# Patient Record
Sex: Female | Born: 1950 | Race: Black or African American | Hispanic: No | Marital: Married | Smoking: Never smoker
Health system: Southern US, Community
[De-identification: ages and names within clinical notes are randomized; demographics above are authoritative.]

## PROBLEM LIST (undated history)

## (undated) DIAGNOSIS — T8859XA Other complications of anesthesia, initial encounter: Secondary | ICD-10-CM

## (undated) DIAGNOSIS — R51 Headache: Secondary | ICD-10-CM

## (undated) DIAGNOSIS — M1990 Unspecified osteoarthritis, unspecified site: Secondary | ICD-10-CM

## (undated) DIAGNOSIS — I517 Cardiomegaly: Secondary | ICD-10-CM

## (undated) DIAGNOSIS — R519 Headache, unspecified: Secondary | ICD-10-CM

## (undated) DIAGNOSIS — J302 Other seasonal allergic rhinitis: Secondary | ICD-10-CM

## (undated) DIAGNOSIS — M2022 Hallux rigidus, left foot: Secondary | ICD-10-CM

## (undated) DIAGNOSIS — E119 Type 2 diabetes mellitus without complications: Secondary | ICD-10-CM

## (undated) DIAGNOSIS — T4145XA Adverse effect of unspecified anesthetic, initial encounter: Secondary | ICD-10-CM

## (undated) DIAGNOSIS — I10 Essential (primary) hypertension: Secondary | ICD-10-CM

## (undated) DIAGNOSIS — D649 Anemia, unspecified: Secondary | ICD-10-CM

## (undated) HISTORY — PX: ABDOMINAL HYSTERECTOMY: SHX81

## (undated) HISTORY — PX: KNEE ARTHROSCOPY: SUR90

## (undated) HISTORY — PX: CERVIX LESION DESTRUCTION: SHX591

## (undated) HISTORY — PX: NASAL SEPTUM SURGERY: SHX37

## (undated) HISTORY — PX: COLONOSCOPY: SHX174

---

## 1999-05-18 ENCOUNTER — Other Ambulatory Visit: Admission: RE | Admit: 1999-05-18 | Discharge: 1999-05-18 | Payer: Self-pay | Admitting: Obstetrics & Gynecology

## 2002-09-03 ENCOUNTER — Other Ambulatory Visit: Admission: RE | Admit: 2002-09-03 | Discharge: 2002-09-03 | Payer: Self-pay | Admitting: Obstetrics & Gynecology

## 2003-09-17 ENCOUNTER — Other Ambulatory Visit: Admission: RE | Admit: 2003-09-17 | Discharge: 2003-09-17 | Payer: Self-pay | Admitting: Obstetrics & Gynecology

## 2004-05-02 ENCOUNTER — Encounter: Admission: RE | Admit: 2004-05-02 | Discharge: 2004-05-02 | Payer: Self-pay | Admitting: Obstetrics & Gynecology

## 2004-09-29 ENCOUNTER — Other Ambulatory Visit: Admission: RE | Admit: 2004-09-29 | Discharge: 2004-09-29 | Payer: Self-pay | Admitting: Obstetrics & Gynecology

## 2005-09-21 ENCOUNTER — Ambulatory Visit (HOSPITAL_BASED_OUTPATIENT_CLINIC_OR_DEPARTMENT_OTHER): Admission: RE | Admit: 2005-09-21 | Discharge: 2005-09-21 | Payer: Self-pay | Admitting: Orthopedic Surgery

## 2005-10-20 ENCOUNTER — Other Ambulatory Visit: Admission: RE | Admit: 2005-10-20 | Discharge: 2005-10-20 | Payer: Self-pay | Admitting: Obstetrics & Gynecology

## 2011-06-22 ENCOUNTER — Encounter (HOSPITAL_BASED_OUTPATIENT_CLINIC_OR_DEPARTMENT_OTHER): Payer: Self-pay | Admitting: Anesthesiology

## 2011-06-22 ENCOUNTER — Ambulatory Visit (HOSPITAL_BASED_OUTPATIENT_CLINIC_OR_DEPARTMENT_OTHER)
Admission: RE | Admit: 2011-06-22 | Discharge: 2011-06-22 | Disposition: A | Discharge status: Home / Self Care | Payer: Managed Care, Other (non HMO) | Source: Ambulatory Visit | Attending: Orthopedic Surgery | Admitting: Orthopedic Surgery

## 2011-06-22 ENCOUNTER — Encounter (HOSPITAL_BASED_OUTPATIENT_CLINIC_OR_DEPARTMENT_OTHER)
Admission: RE | Disposition: A | Discharge status: Home / Self Care | Payer: Self-pay | Source: Ambulatory Visit | Attending: Orthopedic Surgery

## 2011-06-22 ENCOUNTER — Other Ambulatory Visit: Payer: Self-pay

## 2011-06-22 ENCOUNTER — Encounter (HOSPITAL_BASED_OUTPATIENT_CLINIC_OR_DEPARTMENT_OTHER): Payer: Self-pay | Admitting: *Deleted

## 2011-06-22 ENCOUNTER — Ambulatory Visit (HOSPITAL_BASED_OUTPATIENT_CLINIC_OR_DEPARTMENT_OTHER): Payer: Managed Care, Other (non HMO) | Admitting: Anesthesiology

## 2011-06-22 DIAGNOSIS — Z4789 Encounter for other orthopedic aftercare: Secondary | ICD-10-CM

## 2011-06-22 DIAGNOSIS — I10 Essential (primary) hypertension: Secondary | ICD-10-CM | POA: Insufficient documentation

## 2011-06-22 DIAGNOSIS — M2020 Hallux rigidus, unspecified foot: Secondary | ICD-10-CM | POA: Insufficient documentation

## 2011-06-22 DIAGNOSIS — Z01810 Encounter for preprocedural cardiovascular examination: Secondary | ICD-10-CM | POA: Insufficient documentation

## 2011-06-22 HISTORY — DX: Essential (primary) hypertension: I10

## 2011-06-22 HISTORY — DX: Other complications of anesthesia, initial encounter: T88.59XA

## 2011-06-22 HISTORY — PX: CHEILECTOMY: SHX1336

## 2011-06-22 HISTORY — DX: Adverse effect of unspecified anesthetic, initial encounter: T41.45XA

## 2011-06-22 LAB — POCT I-STAT, CHEM 8
Calcium, Ion: 1.16 mmol/L (ref 1.12–1.32)
Chloride: 105 mEq/L (ref 96–112)
Creatinine, Ser: 1 mg/dL (ref 0.50–1.10)
Glucose, Bld: 132 mg/dL — ABNORMAL HIGH (ref 70–99)
Potassium: 4.4 mEq/L (ref 3.5–5.1)

## 2011-06-22 SURGERY — CHEILECTOMY
Anesthesia: General | Site: Toe | Laterality: Right | Wound class: Clean

## 2011-06-22 MED ORDER — PROMETHAZINE HCL 25 MG/ML IJ SOLN: 6.2500 mg | INTRAMUSCULAR | Status: DC | PRN

## 2011-06-22 MED ORDER — OXYCODONE-ACETAMINOPHEN 5-325 MG PO TABS
1.0000 | ORAL_TABLET | ORAL | Status: DC | PRN
  Administered 2011-06-22: 1 via ORAL

## 2011-06-22 MED ORDER — FENTANYL CITRATE 0.05 MG/ML IJ SOLN
50.0000 ug | INTRAMUSCULAR | Status: AC | PRN
  Administered 2011-06-22 (×2): 50 ug via INTRAVENOUS

## 2011-06-22 MED ORDER — PROPOFOL 10 MG/ML IV EMUL
INTRAVENOUS | Status: DC | PRN
  Administered 2011-06-22: 150 mg via INTRAVENOUS

## 2011-06-22 MED ORDER — CEFAZOLIN SODIUM 1-5 GM-% IV SOLN
1.0000 g | INTRAVENOUS | Status: AC
  Administered 2011-06-22: 2 g via INTRAVENOUS

## 2011-06-22 MED ORDER — HYDROMORPHONE HCL PF 1 MG/ML IJ SOLN
0.2500 mg | INTRAMUSCULAR | Status: DC | PRN
  Administered 2011-06-22: 0.5 mg via INTRAVENOUS

## 2011-06-22 MED ORDER — BUPIVACAINE HCL (PF) 0.5 % IJ SOLN
INTRAMUSCULAR | Status: DC | PRN
  Administered 2011-06-22: 6 mL

## 2011-06-22 MED ORDER — DEXAMETHASONE SODIUM PHOSPHATE 10 MG/ML IJ SOLN
INTRAMUSCULAR | Status: DC | PRN
  Administered 2011-06-22: 10 mg via INTRAVENOUS

## 2011-06-22 MED ORDER — MIDAZOLAM HCL 2 MG/2ML IJ SOLN
1.0000 mg | INTRAMUSCULAR | Status: AC | PRN
  Administered 2011-06-22 (×2): 1 mg via INTRAVENOUS

## 2011-06-22 MED ORDER — LACTATED RINGERS IV SOLN
INTRAVENOUS | Status: DC
  Administered 2011-06-22: 07:00:00 via INTRAVENOUS

## 2011-06-22 MED ORDER — CHLORHEXIDINE GLUCONATE 4 % EX LIQD: 60.0000 mL | Freq: Once | CUTANEOUS | Status: DC

## 2011-06-22 MED ORDER — BUPIVACAINE HCL (PF) 0.5 % IJ SOLN
INTRAMUSCULAR | Status: DC | PRN
  Administered 2011-06-22: 35 mL

## 2011-06-22 MED ORDER — FENTANYL CITRATE 0.05 MG/ML IJ SOLN
INTRAMUSCULAR | Status: DC | PRN
  Administered 2011-06-22: 25 ug via INTRAVENOUS

## 2011-06-22 SURGICAL SUPPLY — 61 items
BANDAGE ELASTIC 4 VELCRO ST LF (GAUZE/BANDAGES/DRESSINGS) ×2 IMPLANT
BANDAGE ELASTIC 6 VELCRO ST LF (GAUZE/BANDAGES/DRESSINGS) IMPLANT
BANDAGE ESMARK 6X9 LF (GAUZE/BANDAGES/DRESSINGS) ×1 IMPLANT
BENZOIN TINCTURE PRP APPL 2/3 (GAUZE/BANDAGES/DRESSINGS) IMPLANT
BLADE AVERAGE 25X9 (BLADE) IMPLANT
BLADE OSC/SAG .038X5.5 CUT EDG (BLADE) IMPLANT
BLADE SURG 15 STRL LF DISP TIS (BLADE) ×1 IMPLANT
BLADE SURG 15 STRL SS (BLADE) ×1
BNDG COHESIVE 4X5 TAN STRL (GAUZE/BANDAGES/DRESSINGS) ×2 IMPLANT
BNDG ESMARK 6X9 LF (GAUZE/BANDAGES/DRESSINGS) ×2
CANISTER SUCTION 1200CC (MISCELLANEOUS) IMPLANT
CLOTH BEACON ORANGE TIMEOUT ST (SAFETY) ×2 IMPLANT
COVER TABLE BACK 60X90 (DRAPES) ×2 IMPLANT
CUFF TOURNIQUET SINGLE 18IN (TOURNIQUET CUFF) IMPLANT
CUFF TOURNIQUET SINGLE 24IN (TOURNIQUET CUFF) ×2 IMPLANT
CUFF TOURNIQUET SINGLE 34IN LL (TOURNIQUET CUFF) IMPLANT
DECANTER SPIKE VIAL GLASS SM (MISCELLANEOUS) ×2 IMPLANT
DRAPE EXTREMITY T 121X128X90 (DRAPE) ×2 IMPLANT
DRAPE OEC MINIVIEW 54X84 (DRAPES) ×2 IMPLANT
DRAPE U 20/CS (DRAPES) ×2 IMPLANT
DRAPE U-SHAPE 47X51 STRL (DRAPES) ×2 IMPLANT
DURAPREP 26ML APPLICATOR (WOUND CARE) ×2 IMPLANT
ELECT NEEDLE TIP 2.8 STRL (NEEDLE) IMPLANT
ELECT REM PT RETURN 9FT ADLT (ELECTROSURGICAL) ×2
ELECTRODE REM PT RTRN 9FT ADLT (ELECTROSURGICAL) ×1 IMPLANT
GAUZE XEROFORM 1X8 LF (GAUZE/BANDAGES/DRESSINGS) ×2 IMPLANT
GLOVE BIOGEL PI IND STRL 8 (GLOVE) ×1 IMPLANT
GLOVE BIOGEL PI INDICATOR 8 (GLOVE) ×1
GLOVE ORTHO TXT STRL SZ7.5 (GLOVE) IMPLANT
GLOVE SKINSENSE NS SZ6.5 (GLOVE) ×1
GLOVE SKINSENSE NS SZ7.5 (GLOVE) ×2
GLOVE SKINSENSE STRL SZ6.5 (GLOVE) ×1 IMPLANT
GLOVE SKINSENSE STRL SZ7.5 (GLOVE) ×2 IMPLANT
GOWN BRE IMP PREV XXLGXLNG (GOWN DISPOSABLE) ×2 IMPLANT
GOWN PREVENTION PLUS XLARGE (GOWN DISPOSABLE) ×4 IMPLANT
NEEDLE HYPO 25X1 1.5 SAFETY (NEEDLE) ×2 IMPLANT
NS IRRIG 1000ML POUR BTL (IV SOLUTION) ×2 IMPLANT
PACK BASIN DAY SURGERY FS (CUSTOM PROCEDURE TRAY) ×2 IMPLANT
PAD CAST 3X4 CTTN HI CHSV (CAST SUPPLIES) IMPLANT
PAD CAST 4YDX4 CTTN HI CHSV (CAST SUPPLIES) ×1 IMPLANT
PADDING CAST COTTON 3X4 STRL (CAST SUPPLIES)
PADDING CAST COTTON 4X4 STRL (CAST SUPPLIES) ×1
PENCIL BUTTON HOLSTER BLD 10FT (ELECTRODE) ×2 IMPLANT
SPONGE GAUZE 4X4 12PLY (GAUZE/BANDAGES/DRESSINGS) ×2 IMPLANT
SPONGE LAP 4X18 X RAY DECT (DISPOSABLE) IMPLANT
STOCKINETTE 4X48 STRL (DRAPES) ×2 IMPLANT
SUCTION FRAZIER TIP 10 FR DISP (SUCTIONS) IMPLANT
SUT ETHIBOND 2 OS 4 DA (SUTURE) IMPLANT
SUT ETHILON 3 0 PS 1 (SUTURE) ×2 IMPLANT
SUT VIC AB 0 SH 27 (SUTURE) ×2 IMPLANT
SUT VIC AB 2-0 SH 27 (SUTURE) ×1
SUT VIC AB 2-0 SH 27XBRD (SUTURE) ×1 IMPLANT
SUT VIC AB 3-0 SH 27 (SUTURE) ×1
SUT VIC AB 3-0 SH 27X BRD (SUTURE) ×1 IMPLANT
SUT VICRYL 4-0 PS2 18IN ABS (SUTURE) IMPLANT
SYR BULB 3OZ (MISCELLANEOUS) ×2 IMPLANT
SYR CONTROL 10ML LL (SYRINGE) ×2 IMPLANT
TUBE CONNECTING 20X1/4 (TUBING) IMPLANT
UNDERPAD 30X30 INCONTINENT (UNDERPADS AND DIAPERS) ×2 IMPLANT
WATER STERILE IRR 1000ML POUR (IV SOLUTION) IMPLANT
YANKAUER SUCT BULB TIP NO VENT (SUCTIONS) IMPLANT

## 2011-06-22 NOTE — Interval H&P Note (Signed)
History and Physical Interval Note:  06/22/2011 7:00 AM  Jennifer Ward  has presented today for surgery, with the diagnosis of right great toe hallus rigidus  The various methods of treatment have been discussed with the patient and family. After consideration of risks, benefits and other options for treatment, the patient has consented to  Procedure(s): CHEILECTOMY as a surgical intervention .  The patients' history has been reviewed, patient examined, no change in status, stable for surgery.  I have reviewed the patients' chart and labs.  Questions were answered to the patient's satisfaction.     Francys Bolin F

## 2011-06-22 NOTE — Progress Notes (Signed)
Assisted Dr. Jean Rosenthal with right, ankle block. Side rails up, monitors on throughout procedure. See vital signs in flow sheet. Tolerated Procedure well.

## 2011-06-22 NOTE — Anesthesia Procedure Notes (Addendum)
Anesthesia Regional Block:  Ankle block  Pre-Anesthetic Checklist: ,, timeout performed, Correct Patient, Correct Site, Correct Laterality, Correct Procedure, Correct Position, site marked, Risks and benefits discussed,  Surgical consent,  Pre-op evaluation,  At surgeon's request and post-op pain management  Laterality: Right  Prep: chloraprep       Needles:  Injection technique: Single-shot  Needle Type: Other   (eclipse)    Needle Gauge: 25 and 25 G    Additional Needles: Ankle block Narrative:  Start time: 06/22/2011 7:25 AM End time: 06/22/2011 7:30 AM Injection made incrementally with aspirations every 5 mL.  Performed by: Personally  Anesthesiologist: Sandford Craze, MD  Additional Notes: In holding room, routine monitors on.  Sterile prep R ankle.  #25 ga injections, with total 35cc 0.5% Bupivacaine.  Perineural infiltration around deep and sup peroneal, saph, post tib, and sural nerves, no heme.  VSS  Patient tolerated well. Sandford Craze, MD   Procedure Name: LMA Insertion Performed by: Sharyne Richters Pre-anesthesia Checklist: Patient identified, Timeout performed, Emergency Drugs available, Suction available and Patient being monitored Patient Re-evaluated:Patient Re-evaluated prior to inductionOxygen Delivery Method: Circle System Utilized Preoxygenation: Pre-oxygenation with 100% oxygen Intubation Type: IV induction Ventilation: Mask ventilation without difficulty LMA: LMA with gastric port inserted LMA Size: 4.0 Number of attempts: 1 Placement Confirmation: breath sounds checked- equal and bilateral and positive ETCO2 Tube secured with: Tape Dental Injury: Teeth and Oropharynx as per pre-operative assessment

## 2011-06-22 NOTE — Transfer of Care (Signed)
Immediate Anesthesia Transfer of Care Note  Patient: Jennifer Ward  Procedure(s) Performed:  CHEILECTOMY - right great toe hallux rigidus with cheilectomy 1st metatarsophalangeal  Patient Location: PACU  Anesthesia Type: General  Level of Consciousness: awake  Airway & Oxygen Therapy: Patient Spontanous Breathing and Patient connected to face mask oxygen  Post-op Assessment: Report given to PACU RN and Post -op Vital signs reviewed and stable  Post vital signs: Reviewed and stable  Complications: No apparent anesthesia complications

## 2011-06-22 NOTE — Brief Op Note (Signed)
06/22/2011  8:48 AM  PATIENT:  Jennifer Ward  60 y.o. female  PRE-OPERATIVE DIAGNOSIS:  right great toe hallus rigidus  POST-OPERATIVE DIAGNOSIS:  right great toe hallus rigidus  PROCEDURE:  Procedure(s): Right great toe CHEILECTOMY  SURGEON:  Surgeon(s): Loreta Ave, MD  PHYSICIAN ASSISTANT Andra Heslin M :  EBL:  Total I/O In: 1300 [I.V.:1300] Out: -    SPECIMEN:  No Specimen  DISPOSITION OF SPECIMEN:  N/A  COUNTS:  YES  TOURNIQUET:   Total Tourniquet Time Documented: Calf (Right) - 32 minutes     PATIENT DISPOSITION:  PACU - hemodynamically stable.

## 2011-06-22 NOTE — Anesthesia Preprocedure Evaluation (Signed)
Anesthesia Evaluation  Patient identified by MRN, date of birth, ID band Patient awake    Reviewed: Allergy & Precautions, H&P , NPO status , Patient's Chart, lab work & pertinent test results  Airway Mallampati: II TM Distance: >3 FB Neck ROM: Full    Dental No notable dental hx. (+) Caps, Teeth Intact and Dental Advisory Given   Pulmonary neg pulmonary ROS,  clear to auscultation  Pulmonary exam normal       Cardiovascular hypertension, Regular Normal    Neuro/Psych Negative Neurological ROS     GI/Hepatic negative GI ROS, Neg liver ROS,   Endo/Other  Morbid obesity  Renal/GU negative Renal ROS     Musculoskeletal   Abdominal (+) obese,   Peds  Hematology negative hematology ROS (+)   Anesthesia Other Findings   Reproductive/Obstetrics                           Anesthesia Physical Anesthesia Plan  ASA: II  Anesthesia Plan: General   Post-op Pain Management:    Induction:   Airway Management Planned: LMA  Additional Equipment:   Intra-op Plan:   Post-operative Plan:   Informed Consent: I have reviewed the patients History and Physical, chart, labs and discussed the procedure including the risks, benefits and alternatives for the proposed anesthesia with the patient or authorized representative who has indicated his/her understanding and acceptance.   Dental advisory given  Plan Discussed with: CRNA and Surgeon  Anesthesia Plan Comments: (Plan routine monitors, GA with LMA, ankle block for post op analgesia)        Anesthesia Quick Evaluation

## 2011-06-22 NOTE — Anesthesia Postprocedure Evaluation (Signed)
  Anesthesia Post-op Note  Patient: Jennifer Ward  Procedure(s) Performed:  CHEILECTOMY - right great toe hallux rigidus with cheilectomy 1st metatarsophalangeal  Patient Location: PACU  Anesthesia Type: GA combined with regional for post-op pain  Level of Consciousness: awake, alert  and oriented  Airway and Oxygen Therapy: Patient Spontanous Breathing  Post-op Pain: none  Post-op Assessment: Post-op Vital signs reviewed, Patient's Cardiovascular Status Stable, Respiratory Function Stable, Patent Airway, No signs of Nausea or vomiting and Pain level controlled  Post-op Vital Signs: Reviewed and stable  Complications: No apparent anesthesia complications

## 2011-06-22 NOTE — H&P (Signed)
  [facility] [address]   [address] [place], [place] [address] [phone] A Division of [facility]  Loreta Ave, M.D.     Robert A. Thurston Hole, M.D.     Lunette Stands, M.D. Eulas Post, M.D.    Buford Dresser, M.D. Estell Harpin, M.D. Ralene Cork, D.O.          Genene Churn. Barry Dienes, PA-C            Kirstin A. Shepperson, PA-C Janace Litten, OPA-C   RE: Ori, [place]                                [phone]      DOB: December 20, 1950 PROGRESS NOTE: 05-12-11 Benedicta comes in for evaluation and treatment recommendation for her right foot great toe.  Symptoms longstanding, getting worse.  She did have a Cortisone injection back in 2010 that helped for a couple of weeks.  The last two weeks symptoms have gotten worse.  Localized all to the first MP joint, especially dorsal.  Getting stiff.  Losing motion.  Her symptoms are worse with walking increasing speeds requiring more dorsiflexion.      Entire history is reviewed, updated and included in the chart.  Of note, injection and Jobe exercise program for her left shoulder we did back earlier this year has been helpful.  Symptoms continue to be improved after a course of therapy.  She is maintaining good motion.    EXAMINATION: General exam is outlined and included in the chart.  Specifically, healthy appearing 60 year-old female.  Height: 5?5.  Weight: 205 pounds.  Her gait and stance are normal.  Good motion in all joints upper and lower extremities, except for the right great toe.  Obvious hallux rigidus and dorsal spurring.  Dorsiflexion is just about 0 degrees with bony abutment.  Not much pain when the toe is plantar flexed.  Not a lot of grinding, but some crepitus.  No significant hallux valgus or varus.  No lesser toe deformities.  No tenderness or swelling in the midfoot or hindfoot.  Good motion throughout.  Opposite left foot does not have hallux rigidus.        X-RAYS: X-rays show significant hallux rigidus with especially dorsal spurring first MP joint, right foot.  There are global spurs, but most marked dorsally.  50% narrowing of the joint, but certainly not bone on bone.    DISPOSITION:  Progressive hallux rigidus, right foot.  Underlying degenerative arthritis.  Excellent candidate for cheilectomy.  Went over diagnosis and treatment options with her.  I have told her that this procedure will help with motion and help with function, but eventually she could have progressive arthritis that could come down to arthrodesis.  She understands and agrees.  At this point in time further injection is really not going to help her and she agrees.  Procedures, risks, benefits and complications reviewed in detail.  Paperwork complete.  All questions answered.  Anticipated postoperative course reviewed.  See her at the time of operative intervention.  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    Loreta Ave, M.D.   Electronically verified by Loreta Ave, M.D. DFM:jjh D 05-12-11 T 05-15-11

## 2011-06-23 ENCOUNTER — Encounter (HOSPITAL_BASED_OUTPATIENT_CLINIC_OR_DEPARTMENT_OTHER): Payer: Self-pay | Admitting: Orthopedic Surgery

## 2011-06-23 NOTE — Op Note (Signed)
NAME:  Jennifer Ward, Jennifer Ward                       ACCOUNT NO.:  MEDICAL RECORD NO.:  [id number]  LOCATION:                                 FACILITY:  PHYSICIAN:  Loreta Ave, M.D.      DATE OF BIRTH:  DATE OF PROCEDURE:  06/22/2011 DATE OF DISCHARGE:                              OPERATIVE REPORT   PREOPERATIVE DIAGNOSIS:  Right foot hallux rigidus, great toe.  POSTOPERATIVE DIAGNOSIS:  Right foot hallux rigidus, great toe.  PROCEDURE:  Right foot great toe exploration, soft tissue release, cheilectomy.  SURGEON:  Loreta Ave, MD  ASSISTANT:  Genene Churn. Barry Dienes, [place]  ANESTHESIA:  General.  BLOOD LOSS:  Minimal.  SPECIMENS:  None.  CULTURES:  None.  COMPLICATION:  None.  DRESSING:  Soft compressive.  TOURNIQUET TIME:  40 minutes.  PROCEDURE:  The patient was brought to operating room, placed on the operating table in supine position.  After adequate anesthesia had been obtained, foot was prepped and draped in usual sterile fashion. Exsanguinated with elevation of Esmarch.  Tourniquet inflated to 250 mmHg.  Fluoroscopic guidance.  Virtually, no dorsiflexion of the great toe because of dorsal spurring.  Longitudinal incision, dorsal and medial.  Skin and subcutaneous tissues were divided.  Sensory nerves were identified and protected.  Longitudinal exposure of the MP joint with subperiosteal elevation throughout.  Marked spurring on the dorsal aspect of first metatarsal head and the proximal aspect of proximal phalanx completely removed, contoured down smoothly.  This was extended medially and laterally.  At completion, removal of all spurs confirmed. I could get 90 degrees of dorsiflexion visually and fluoroscopically without any difficulty.  Wound irrigated.  There were diffuse grade 3 changes throughout the remaining joint but not completely worn out.  After through irrigation, the capsule was closed with Vicryl.  Skin was closed with nylon.  Margins were injected  with Marcaine.  Sterile compressive dressing was applied.  Anesthesia reversed.  Brought to the recovery room.  Tolerated the surgery well. No complications.     Loreta Ave, M.D.     DFM/MEDQ  D:  06/22/2011  T:  06/22/2011  Job:  [id number]

## 2014-09-08 DIAGNOSIS — M2022 Hallux rigidus, left foot: Secondary | ICD-10-CM

## 2014-09-08 HISTORY — DX: Hallux rigidus, left foot: M20.22

## 2014-09-14 ENCOUNTER — Other Ambulatory Visit: Payer: Self-pay | Admitting: Physician Assistant

## 2014-09-17 ENCOUNTER — Encounter (HOSPITAL_BASED_OUTPATIENT_CLINIC_OR_DEPARTMENT_OTHER): Payer: Self-pay | Admitting: *Deleted

## 2014-09-17 NOTE — Progress Notes (Signed)
   09/17/14 1053  OBSTRUCTIVE SLEEP APNEA  Have you ever been diagnosed with sleep apnea through a sleep study? No  Do you snore loudly (loud enough to be heard through closed doors)?  1  Do you often feel tired, fatigued, or sleepy during the daytime? 0  Has anyone observed you stop breathing during your sleep? 0  Do you have, or are you being treated for high blood pressure? 1  BMI more than 35 kg/m2? 1  Age over 63 years old? 1  Gender: 0  Obstructive Sleep Apnea Score 4

## 2014-09-17 NOTE — Pre-Procedure Instructions (Signed)
To come for BMET and EKG 

## 2014-09-21 ENCOUNTER — Other Ambulatory Visit: Payer: Self-pay

## 2014-09-21 ENCOUNTER — Encounter (HOSPITAL_BASED_OUTPATIENT_CLINIC_OR_DEPARTMENT_OTHER)
Admission: RE | Admit: 2014-09-21 | Discharge: 2014-09-21 | Disposition: A | Discharge status: Home / Self Care | Payer: BLUE CROSS/BLUE SHIELD | Source: Ambulatory Visit | Attending: Orthopedic Surgery | Admitting: Orthopedic Surgery

## 2014-09-21 DIAGNOSIS — M2022 Hallux rigidus, left foot: Secondary | ICD-10-CM | POA: Diagnosis not present

## 2014-09-21 DIAGNOSIS — Z6835 Body mass index (BMI) 35.0-35.9, adult: Secondary | ICD-10-CM | POA: Diagnosis not present

## 2014-09-21 DIAGNOSIS — G4733 Obstructive sleep apnea (adult) (pediatric): Secondary | ICD-10-CM | POA: Diagnosis not present

## 2014-09-21 DIAGNOSIS — E669 Obesity, unspecified: Secondary | ICD-10-CM | POA: Diagnosis not present

## 2014-09-21 DIAGNOSIS — E119 Type 2 diabetes mellitus without complications: Secondary | ICD-10-CM | POA: Diagnosis not present

## 2014-09-21 DIAGNOSIS — I10 Essential (primary) hypertension: Secondary | ICD-10-CM | POA: Diagnosis not present

## 2014-09-22 NOTE — H&P (Signed)
  [facility] [address]   [address] [place], [place] [address] [phone] A Division of [facility]  Loreta Ave, M.D.   Robert A. Thurston Hole, M.D.   Burnell Blanks, M.D.   Eulas Post, M.D.   Lunette Stands, M.D Jewel Baize. Eulah Pont, M.D.  Buford Dresser, M.D.  Estell Harpin, M.D.    Melina Fiddler, M.D. Janalee Dane, PA- C  Mary L. Dub Mikes, PA-C  Kirstin A. Shepperson, PA-C  Josh Fairview, PA-C  [place], [place]   RE: [place], [place]   [phone]      DOB: 04/01/1951 PROGRESS NOTE: 08-18-14 64 year old here with left great toe pain. She has history of hallux rigidus on the left. On 12/28 she had acute flare-up and saw Dr. Farris Has who put her on a 12 day Dosepak and Indomethacin which helped but pain recurred. She has pain which is the same but swelling is decreased. She describes a constant throb worse with walking and a little swelling. She's changed her diet in hopes of decreasing uric acid. She has not had an aspiration or injection in the past.  Past medical, family, social history is detailed on the chart. Review of systems detailed in HPI all others are reviewed and are negative.   EXAMINATION: Well-developed well-nourished female in no acute distress. Alert and oriented x3. Exam of the  left great toe shows moderate tenderness to deep palpation in the 1st MTP joint dorsiflexion to 10 degrees, moderate swelling. She is neurovascularly intact distally. Negative grind test left great toe.  X-RAYS: Previous x-rays left foot show moderate spurring dorsum 1st MTP joint.  IMPRESSION: Flare of hallux rigidus left great toe.  PLAN: At this point we feel it's appropriate to proceed with left great toe chielectomy. She had this procedure on the left foot 4 years ago and is doing well with this. Discussed risks benefits and possible complications of the surgery in detail. All questions answered and paperwork  complete. I gave her a post-op prescription for Percocet 5/325 #60.  Loreta Ave, M.D.  Electronically verified by Loreta Ave, M.D. DFM(MLS):kah D 08-18-14 T 08-21-14

## 2014-09-24 ENCOUNTER — Ambulatory Visit (HOSPITAL_BASED_OUTPATIENT_CLINIC_OR_DEPARTMENT_OTHER): Payer: BLUE CROSS/BLUE SHIELD | Admitting: Anesthesiology

## 2014-09-24 ENCOUNTER — Ambulatory Visit (HOSPITAL_BASED_OUTPATIENT_CLINIC_OR_DEPARTMENT_OTHER)
Admission: RE | Admit: 2014-09-24 | Discharge: 2014-09-24 | Disposition: A | Discharge status: Home / Self Care | Payer: BLUE CROSS/BLUE SHIELD | Source: Ambulatory Visit | Attending: Orthopedic Surgery | Admitting: Orthopedic Surgery

## 2014-09-24 ENCOUNTER — Encounter (HOSPITAL_BASED_OUTPATIENT_CLINIC_OR_DEPARTMENT_OTHER): Payer: Self-pay

## 2014-09-24 ENCOUNTER — Encounter (HOSPITAL_BASED_OUTPATIENT_CLINIC_OR_DEPARTMENT_OTHER)
Admission: RE | Disposition: A | Discharge status: Home / Self Care | Payer: Self-pay | Source: Ambulatory Visit | Attending: Orthopedic Surgery

## 2014-09-24 DIAGNOSIS — M2022 Hallux rigidus, left foot: Secondary | ICD-10-CM | POA: Diagnosis not present

## 2014-09-24 DIAGNOSIS — E119 Type 2 diabetes mellitus without complications: Secondary | ICD-10-CM | POA: Insufficient documentation

## 2014-09-24 DIAGNOSIS — E669 Obesity, unspecified: Secondary | ICD-10-CM | POA: Insufficient documentation

## 2014-09-24 DIAGNOSIS — I10 Essential (primary) hypertension: Secondary | ICD-10-CM | POA: Insufficient documentation

## 2014-09-24 DIAGNOSIS — Z6835 Body mass index (BMI) 35.0-35.9, adult: Secondary | ICD-10-CM | POA: Insufficient documentation

## 2014-09-24 DIAGNOSIS — G4733 Obstructive sleep apnea (adult) (pediatric): Secondary | ICD-10-CM | POA: Insufficient documentation

## 2014-09-24 HISTORY — PX: CHEILECTOMY: SHX1336

## 2014-09-24 HISTORY — DX: Anemia, unspecified: D64.9

## 2014-09-24 HISTORY — DX: Hallux rigidus, left foot: M20.22

## 2014-09-24 HISTORY — DX: Type 2 diabetes mellitus without complications: E11.9

## 2014-09-24 HISTORY — DX: Cardiomegaly: I51.7

## 2014-09-24 HISTORY — DX: Other seasonal allergic rhinitis: J30.2

## 2014-09-24 HISTORY — DX: Unspecified osteoarthritis, unspecified site: M19.90

## 2014-09-24 HISTORY — DX: Headache: R51

## 2014-09-24 HISTORY — DX: Headache, unspecified: R51.9

## 2014-09-24 LAB — POCT I-STAT, CHEM 8
BUN: 10 mg/dL (ref 6–23)
CALCIUM ION: 1.08 mmol/L — AB (ref 1.13–1.30)
Chloride: 104 mmol/L (ref 96–112)
Creatinine, Ser: 0.8 mg/dL (ref 0.50–1.10)
GLUCOSE: 158 mg/dL — AB (ref 70–99)
HCT: 40 % (ref 36.0–46.0)
Hemoglobin: 13.6 g/dL (ref 12.0–15.0)
Potassium: 3.8 mmol/L (ref 3.5–5.1)
SODIUM: 137 mmol/L (ref 135–145)
TCO2: 19 mmol/L (ref 0–100)

## 2014-09-24 LAB — GLUCOSE, CAPILLARY: GLUCOSE-CAPILLARY: 161 mg/dL — AB (ref 70–99)

## 2014-09-24 SURGERY — CHEILECTOMY
Anesthesia: General | Site: Foot | Laterality: Left

## 2014-09-24 MED ORDER — MIDAZOLAM HCL 2 MG/2ML IJ SOLN
INTRAMUSCULAR | Status: AC
  Filled 2014-09-24: qty 2

## 2014-09-24 MED ORDER — FENTANYL CITRATE 0.05 MG/ML IJ SOLN
INTRAMUSCULAR | Status: AC
  Filled 2014-09-24: qty 6

## 2014-09-24 MED ORDER — OXYCODONE HCL 5 MG PO TABS
5.0000 mg | ORAL_TABLET | Freq: Once | ORAL | Status: AC | PRN
  Administered 2014-09-24: 5 mg via ORAL

## 2014-09-24 MED ORDER — OXYCODONE HCL 5 MG/5ML PO SOLN: 5.0000 mg | Freq: Once | ORAL | Status: AC | PRN

## 2014-09-24 MED ORDER — HYDROMORPHONE HCL 1 MG/ML IJ SOLN
INTRAMUSCULAR | Status: AC
  Filled 2014-09-24: qty 1

## 2014-09-24 MED ORDER — FENTANYL CITRATE 0.05 MG/ML IJ SOLN
INTRAMUSCULAR | Status: AC
  Filled 2014-09-24: qty 2

## 2014-09-24 MED ORDER — CHLORHEXIDINE GLUCONATE 4 % EX LIQD: 60.0000 mL | Freq: Once | CUTANEOUS | Status: DC

## 2014-09-24 MED ORDER — PROPOFOL 10 MG/ML IV BOLUS
INTRAVENOUS | Status: DC | PRN
  Administered 2014-09-24: 200 mg via INTRAVENOUS

## 2014-09-24 MED ORDER — LACTATED RINGERS IV SOLN
INTRAVENOUS | Status: DC
  Administered 2014-09-24: 100 mL/h via INTRAVENOUS

## 2014-09-24 MED ORDER — LIDOCAINE HCL (CARDIAC) 20 MG/ML IV SOLN
INTRAVENOUS | Status: DC | PRN
  Administered 2014-09-24: 60 mg via INTRAVENOUS

## 2014-09-24 MED ORDER — HYDROMORPHONE HCL 1 MG/ML IJ SOLN
0.2500 mg | INTRAMUSCULAR | Status: DC | PRN
  Administered 2014-09-24: 0.5 mg via INTRAVENOUS
  Administered 2014-09-24 (×2): 0.25 mg via INTRAVENOUS

## 2014-09-24 MED ORDER — DEXAMETHASONE SODIUM PHOSPHATE 4 MG/ML IJ SOLN
INTRAMUSCULAR | Status: DC | PRN
  Administered 2014-09-24: 10 mg via INTRAVENOUS

## 2014-09-24 MED ORDER — CEFAZOLIN SODIUM-DEXTROSE 2-3 GM-% IV SOLR
2.0000 g | INTRAVENOUS | Status: AC
  Administered 2014-09-24: 2 g via INTRAVENOUS

## 2014-09-24 MED ORDER — CEFAZOLIN SODIUM-DEXTROSE 2-3 GM-% IV SOLR
INTRAVENOUS | Status: AC
  Filled 2014-09-24: qty 50

## 2014-09-24 MED ORDER — FENTANYL CITRATE 0.05 MG/ML IJ SOLN
50.0000 ug | INTRAMUSCULAR | Status: DC | PRN
  Administered 2014-09-24: 100 ug via INTRAVENOUS

## 2014-09-24 MED ORDER — LACTATED RINGERS IV SOLN
INTRAVENOUS | Status: DC
  Administered 2014-09-24: 07:00:00 via INTRAVENOUS

## 2014-09-24 MED ORDER — MIDAZOLAM HCL 2 MG/2ML IJ SOLN
1.0000 mg | INTRAMUSCULAR | Status: DC | PRN
  Administered 2014-09-24: 2 mg via INTRAVENOUS

## 2014-09-24 MED ORDER — ONDANSETRON HCL 4 MG/2ML IJ SOLN: 4.0000 mg | Freq: Once | INTRAMUSCULAR | Status: DC | PRN

## 2014-09-24 MED ORDER — OXYCODONE HCL 5 MG PO TABS
ORAL_TABLET | ORAL | Status: AC
  Filled 2014-09-24: qty 1

## 2014-09-24 MED ORDER — FENTANYL CITRATE 0.05 MG/ML IJ SOLN
INTRAMUSCULAR | Status: DC | PRN
  Administered 2014-09-24: 25 ug via INTRAVENOUS
  Administered 2014-09-24: 50 ug via INTRAVENOUS

## 2014-09-24 MED ORDER — BUPIVACAINE HCL (PF) 0.25 % IJ SOLN
INTRAMUSCULAR | Status: AC
  Filled 2014-09-24: qty 30

## 2014-09-24 MED ORDER — BUPIVACAINE-EPINEPHRINE (PF) 0.5% -1:200000 IJ SOLN
INTRAMUSCULAR | Status: DC | PRN
  Administered 2014-09-24: 25 mL via PERINEURAL

## 2014-09-24 SURGICAL SUPPLY — 60 items
BANDAGE ELASTIC 4 VELCRO ST LF (GAUZE/BANDAGES/DRESSINGS) IMPLANT
BANDAGE ELASTIC 6 VELCRO ST LF (GAUZE/BANDAGES/DRESSINGS) IMPLANT
BANDAGE ESMARK 6X9 LF (GAUZE/BANDAGES/DRESSINGS) ×1 IMPLANT
BENZOIN TINCTURE PRP APPL 2/3 (GAUZE/BANDAGES/DRESSINGS) IMPLANT
BLADE AVERAGE 25MMX9MM (BLADE)
BLADE AVERAGE 25X9 (BLADE) IMPLANT
BLADE OSC/SAG .038X5.5 CUT EDG (BLADE) IMPLANT
BLADE SURG 15 STRL LF DISP TIS (BLADE) ×1 IMPLANT
BLADE SURG 15 STRL SS (BLADE) ×2
BNDG COHESIVE 4X5 TAN STRL (GAUZE/BANDAGES/DRESSINGS) ×3 IMPLANT
BNDG ESMARK 6X9 LF (GAUZE/BANDAGES/DRESSINGS) ×3
CANISTER SUCT 1200ML W/VALVE (MISCELLANEOUS) IMPLANT
CLOSURE WOUND 1/2 X4 (GAUZE/BANDAGES/DRESSINGS) ×1
COVER BACK TABLE 60X90IN (DRAPES) ×3 IMPLANT
CUFF TOURNIQUET SINGLE 18IN (TOURNIQUET CUFF) IMPLANT
CUFF TOURNIQUET SINGLE 24IN (TOURNIQUET CUFF) IMPLANT
DECANTER SPIKE VIAL GLASS SM (MISCELLANEOUS) IMPLANT
DRAPE EXTREMITY T 121X128X90 (DRAPE) ×3 IMPLANT
DRAPE OEC MINIVIEW 54X84 (DRAPES) IMPLANT
DRAPE U 20/CS (DRAPES) ×3 IMPLANT
DRAPE U-SHAPE 47X51 STRL (DRAPES) ×3 IMPLANT
DURAPREP 26ML APPLICATOR (WOUND CARE) ×3 IMPLANT
ELECT REM PT RETURN 9FT ADLT (ELECTROSURGICAL) ×3
ELECTRODE REM PT RTRN 9FT ADLT (ELECTROSURGICAL) ×1 IMPLANT
GAUZE SPONGE 4X4 12PLY STRL (GAUZE/BANDAGES/DRESSINGS) ×3 IMPLANT
GAUZE XEROFORM 1X8 LF (GAUZE/BANDAGES/DRESSINGS) ×3 IMPLANT
GLOVE BIOGEL PI IND STRL 7.0 (GLOVE) ×1 IMPLANT
GLOVE BIOGEL PI INDICATOR 7.0 (GLOVE) ×2
GLOVE SURG SS PI 7.0 STRL IVOR (GLOVE) ×3 IMPLANT
GLOVE SURG SS PI 7.5 STRL IVOR (GLOVE) ×6 IMPLANT
GLOVE SURG SS PI 8.0 STRL IVOR (GLOVE) IMPLANT
GOWN STRL REUS W/ TWL LRG LVL3 (GOWN DISPOSABLE) ×2 IMPLANT
GOWN STRL REUS W/ TWL XL LVL3 (GOWN DISPOSABLE) ×1 IMPLANT
GOWN STRL REUS W/TWL LRG LVL3 (GOWN DISPOSABLE) ×4
GOWN STRL REUS W/TWL XL LVL3 (GOWN DISPOSABLE) ×5 IMPLANT
NEEDLE HYPO 25X1 1.5 SAFETY (NEEDLE) IMPLANT
NS IRRIG 1000ML POUR BTL (IV SOLUTION) ×3 IMPLANT
PACK BASIN DAY SURGERY FS (CUSTOM PROCEDURE TRAY) ×3 IMPLANT
PAD CAST 3X4 CTTN HI CHSV (CAST SUPPLIES) IMPLANT
PAD CAST 4YDX4 CTTN HI CHSV (CAST SUPPLIES) ×2 IMPLANT
PADDING CAST COTTON 3X4 STRL (CAST SUPPLIES)
PADDING CAST COTTON 4X4 STRL (CAST SUPPLIES) ×4
PENCIL BUTTON HOLSTER BLD 10FT (ELECTRODE) ×3 IMPLANT
SPONGE LAP 4X18 X RAY DECT (DISPOSABLE) IMPLANT
STOCKINETTE 4X48 STRL (DRAPES) ×3 IMPLANT
STRIP CLOSURE SKIN 1/2X4 (GAUZE/BANDAGES/DRESSINGS) ×2 IMPLANT
SUCTION FRAZIER TIP 10 FR DISP (SUCTIONS) IMPLANT
SUT ETHIBOND 2 OS 4 DA (SUTURE) IMPLANT
SUT ETHILON 3 0 PS 1 (SUTURE) ×3 IMPLANT
SUT VIC AB 2-0 SH 27 (SUTURE)
SUT VIC AB 2-0 SH 27XBRD (SUTURE) IMPLANT
SUT VIC AB 3-0 SH 27 (SUTURE)
SUT VIC AB 3-0 SH 27X BRD (SUTURE) IMPLANT
SUT VICRYL 4-0 PS2 18IN ABS (SUTURE) IMPLANT
SYR BULB 3OZ (MISCELLANEOUS) ×3 IMPLANT
SYR CONTROL 10ML LL (SYRINGE) ×3 IMPLANT
TUBE CONNECTING 20'X1/4 (TUBING)
TUBE CONNECTING 20X1/4 (TUBING) IMPLANT
UNDERPAD 30X30 INCONTINENT (UNDERPADS AND DIAPERS) ×3 IMPLANT
YANKAUER SUCT BULB TIP NO VENT (SUCTIONS) IMPLANT

## 2014-09-24 NOTE — Interval H&P Note (Signed)
History and Physical Interval Note:  09/24/2014 7:32 AM  Jennifer Ward  has presented today for surgery, with the diagnosis of hallux rigidus,left foot M20.22  The various methods of treatment have been discussed with the patient and family. After consideration of risks, benefits and other options for treatment, the patient has consented to  Procedure(s): LEFT FOOT HALLUX RIGIDUS WITH CHEILECTOMY GREAT TOE (Left) as a surgical intervention .  The patient's history has been reviewed, patient examined, no change in status, stable for surgery.  I have reviewed the patient's chart and labs.  Questions were answered to the patient's satisfaction.     Yolando Gillum F

## 2014-09-24 NOTE — Anesthesia Preprocedure Evaluation (Signed)
Anesthesia Evaluation  Patient identified by MRN, date of birth, ID band Patient awake    Reviewed: Allergy & Precautions, NPO status , Patient's Chart, lab work & pertinent test results  Airway Mallampati: I  TM Distance: >3 FB Neck ROM: Full    Dental  (+) Teeth Intact, Dental Advisory Given   Pulmonary  breath sounds clear to auscultation        Cardiovascular hypertension, Pt. on medications Rhythm:Regular Rate:Normal     Neuro/Psych    GI/Hepatic   Endo/Other  diabetes, Poorly Controlled, Type 2, Oral Hypoglycemic AgentsMorbid obesity  Renal/GU      Musculoskeletal   Abdominal   Peds  Hematology   Anesthesia Other Findings   Reproductive/Obstetrics                             Anesthesia Physical Anesthesia Plan  ASA: III  Anesthesia Plan: General   Post-op Pain Management:    Induction: Intravenous  Airway Management Planned: LMA  Additional Equipment:   Intra-op Plan:   Post-operative Plan: Extubation in OR  Informed Consent: I have reviewed the patients History and Physical, chart, labs and discussed the procedure including the risks, benefits and alternatives for the proposed anesthesia with the patient or authorized representative who has indicated his/her understanding and acceptance.   Dental advisory given  Plan Discussed with: CRNA, Anesthesiologist and Surgeon  Anesthesia Plan Comments:         Anesthesia Quick Evaluation

## 2014-09-24 NOTE — Discharge Instructions (Signed)
Weight bearing as tolerated but must be in post-op shoe.  Change bandage daily starting in 3 days.  May shower in 3 days, but do not soak incision.  May apply ice for up to 20 minutes at a time for pain and swelling.  Follow up appointment in one week.   SEEK IMMEDIATE MEDICAL CARE IF:   You develop increased redness, swelling, or pain around your incision sites.  You have a lot of pain in your leg when you move your foot up and down at your ankle.  There is pus or any unusual drainage coming from your incision sites.  You develop a fever.  You notice a bad smell coming from your incision sites.  Any of your incisions break open (edges do not stay together) after sutures or staples have been removed. Document Released: 01/13/2005 Document Revised: 11/10/2013 Document Reviewed: 11/19/2011 [facility] Patient Information 2015 [place], [place]. This information is not intended to replace advice given to you by your health care provider. Make sure you discuss any questions you have with your health care provider.   Post Anesthesia Home Care Instructions  Activity: Get plenty of rest for the remainder of the day. A responsible adult should stay with you for 24 hours following the procedure.  For the next 24 hours, DO NOT: -Drive a car -Advertising copywriter -Drink alcoholic beverages -Take any medication unless instructed by your physician -Make any legal decisions or sign important papers.  Meals: Start with liquid foods such as gelatin or soup. Progress to regular foods as tolerated. Avoid greasy, spicy, heavy foods. If nausea and/or vomiting occur, drink only clear liquids until the nausea and/or vomiting subsides. Call your physician if vomiting continues.  Special Instructions/Symptoms: Your throat may feel dry or sore from the anesthesia or the breathing tube placed in your throat during surgery. If this causes discomfort, gargle with warm salt water. The discomfort should disappear  within 24 hours.

## 2014-09-24 NOTE — Progress Notes (Signed)
Assisted Dr. Crews with left, ultrasound guided, popliteal/saphenous block. Side rails up, monitors on throughout procedure. See vital signs in flow sheet. Tolerated Procedure well. 

## 2014-09-24 NOTE — Anesthesia Postprocedure Evaluation (Signed)
  Anesthesia Post-op Note  Patient: Jennifer Ward  Procedure(s) Performed: Procedure(s): LEFT FOOT HALLUX RIGIDUS WITH CHEILECTOMY GREAT TOE (Left)  Patient Location: PACU  Anesthesia Type: General   Level of Consciousness: awake, alert  and oriented  Airway and Oxygen Therapy: Patient Spontanous Breathing  Post-op Pain: mild  Post-op Assessment: Post-op Vital signs reviewed  Post-op Vital Signs: Reviewed  Last Vitals:  Filed Vitals:   09/24/14 0911  BP:   Pulse: 69  Temp:   Resp: 20    Complications: No apparent anesthesia complications

## 2014-09-24 NOTE — Anesthesia Procedure Notes (Addendum)
Anesthesia Regional Block:  Adductor canal block  Pre-Anesthetic Checklist: ,, timeout performed, Correct Patient, Correct Site, Correct Laterality, Correct Procedure, Correct Position, site marked, Risks and benefits discussed,  Surgical consent,  Pre-op evaluation,  At surgeon's request and post-op pain management  Laterality: Left and Lower  Prep: chloraprep       Needles:  Injection technique: Single-shot  Needle Type: Echogenic Needle     Needle Length: 9cm 9 cm Needle Gauge: 21 and 21 G    Additional Needles:  Procedures: ultrasound guided (picture in chart) Adductor canal block Narrative:  Start time: 09/24/2014 7:10 AM End time: 09/24/2014 7:15 AM Injection made incrementally with aspirations every 5 mL.  Performed by: Personally  Anesthesiologist: CREWS, DAVID   Procedure Name: LMA Insertion Performed by: York Grice Pre-anesthesia Checklist: Patient identified, Timeout performed, Emergency Drugs available, Suction available and Patient being monitored Patient Re-evaluated:Patient Re-evaluated prior to inductionOxygen Delivery Method: Circle system utilized Preoxygenation: Pre-oxygenation with 100% oxygen Intubation Type: IV induction Ventilation: Mask ventilation without difficulty LMA: LMA inserted LMA Size: 4.0 Tube type: Oral Number of attempts: 1 Placement Confirmation: positive ETCO2 Tube secured with: Tape Dental Injury: Teeth and Oropharynx as per pre-operative assessment

## 2014-09-24 NOTE — Transfer of Care (Signed)
Immediate Anesthesia Transfer of Care Note  Patient: Jennifer Ward  Procedure(s) Performed: Procedure(s): LEFT FOOT HALLUX RIGIDUS WITH CHEILECTOMY GREAT TOE (Left)  Patient Location: PACU  Anesthesia Type:General  Level of Consciousness: awake and sedated  Airway & Oxygen Therapy: Patient Spontanous Breathing and Patient connected to face mask oxygen  Post-op Assessment: Report given to RN and Post -op Vital signs reviewed and stable  Post vital signs: Reviewed and stable  Last Vitals:  Filed Vitals:   09/24/14 0715  BP:   Pulse: 64  Temp:   Resp: 14    Complications: No apparent anesthesia complications

## 2014-09-24 NOTE — Addendum Note (Signed)
Addendum  created 09/24/14 1019 by York Grice, CRNA   Modules edited: Anesthesia Attestations

## 2014-09-25 ENCOUNTER — Encounter (HOSPITAL_BASED_OUTPATIENT_CLINIC_OR_DEPARTMENT_OTHER): Payer: Self-pay | Admitting: Orthopedic Surgery

## 2014-09-25 NOTE — Op Note (Signed)
[name], [name] NO.:  [id number]  MEDICAL RECORD NO.:  [id number]  LOCATION:                                 FACILITY:  PHYSICIAN:  Loreta Ave, M.D. DATE OF BIRTH:  06-14-1951  DATE OF PROCEDURE:  09/24/2014 DATE OF DISCHARGE:  09/23/2014                              OPERATIVE REPORT   PREOPERATIVE DIAGNOSIS:  Hallux rigidus, left foot, first metatarsophalangeal joint.  POSTOPERATIVE DIAGNOSIS:  Hallux rigidus, left foot, first metatarsophalangeal joint.  PROCEDURE:  Cheilectomy first metatarsophalangeal joint, left foot.  SURGEON:  Loreta Ave, MD  ASSISTANT:  Piedad Climes.  ANESTHESIA:  General.  BLOOD LOSS:  Minimal.  SPECIMENS:  None.  CULTURES:  None.  COMPLICATIONS:  None.  DRESSINGS:  Soft compressive wood shoe.  TOURNIQUET TIME:  30 minutes.  DESCRIPTION OF PROCEDURE:  The patient was brought to the operating room, placed on the operating table in supine position.  After adequate anesthesia had been obtained, tourniquet applied, prepped and draped in usual sterile fashion.  Exsanguinated with elevation of Esmarch. Tourniquet inflated to 250 mmHg.  Fluoroscopic guidance throughout. Less than 30 degrees of dorsiflexion with bony abutment dorsal aspect first MTP joint.  Alignment and stability otherwise good.  Longitudinal incision dorsal slightly medial.  Skin and subcutaneous tissue divided. The joint exposed and capsule opened.  All of the thinning spurs on the phalanx as well as the metatarsal head were removed with the oscillating saw and rongeurs and contoured smoothly.  Remaining articular cartilage looked good.  Wound irrigated.  I can get 90 degrees of dorsiflexion on completion.  Capsule closed with Vicryl with nylon on skin.  Sterile compressive dressing applied.  Tourniquet deflated and removed. Anesthesia reversed.  Brought to the recovery room.  Tolerated the surgery well.  No  complications.     Loreta Ave, M.D.     DFM/MEDQ  D:  09/24/2014  T:  09/25/2014  Job:  [id number]

## 2017-04-16 ENCOUNTER — Other Ambulatory Visit: Payer: Self-pay | Admitting: Obstetrics & Gynecology

## 2017-04-16 DIAGNOSIS — R928 Other abnormal and inconclusive findings on diagnostic imaging of breast: Secondary | ICD-10-CM

## 2017-04-23 ENCOUNTER — Ambulatory Visit
Admission: RE | Admit: 2017-04-23 | Discharge: 2017-04-23 | Disposition: A | Discharge status: Home / Self Care | Payer: BLUE CROSS/BLUE SHIELD | Source: Ambulatory Visit | Attending: Obstetrics & Gynecology | Admitting: Obstetrics & Gynecology

## 2017-04-23 ENCOUNTER — Ambulatory Visit: Admission: RE | Admit: 2017-04-23 | Payer: BLUE CROSS/BLUE SHIELD | Source: Ambulatory Visit

## 2017-04-23 DIAGNOSIS — R928 Other abnormal and inconclusive findings on diagnostic imaging of breast: Secondary | ICD-10-CM

## 2017-12-10 ENCOUNTER — Ambulatory Visit (INDEPENDENT_AMBULATORY_CARE_PROVIDER_SITE_OTHER): Payer: BLUE CROSS/BLUE SHIELD | Admitting: Podiatry

## 2017-12-10 ENCOUNTER — Encounter: Payer: Self-pay | Admitting: Podiatry

## 2017-12-10 ENCOUNTER — Ambulatory Visit (INDEPENDENT_AMBULATORY_CARE_PROVIDER_SITE_OTHER): Payer: BLUE CROSS/BLUE SHIELD

## 2017-12-10 DIAGNOSIS — M79672 Pain in left foot: Secondary | ICD-10-CM

## 2017-12-10 DIAGNOSIS — M722 Plantar fascial fibromatosis: Secondary | ICD-10-CM | POA: Diagnosis not present

## 2017-12-10 DIAGNOSIS — M779 Enthesopathy, unspecified: Secondary | ICD-10-CM | POA: Diagnosis not present

## 2017-12-10 MED ORDER — MELOXICAM 15 MG PO TABS: 15.0000 mg | ORAL_TABLET | Freq: Every day | ORAL | 2 refills | Status: DC

## 2017-12-10 NOTE — Progress Notes (Signed)
Subjective:   Patient ID: [name], female   DOB: 67 y.o.   MRN: [id number]   HPI 67 year old female presents the office today for concerns of left foot pain which is been ongoing since February.  She was on vacation in April 2019.  She is borderline diabetic her A1c 5.9.  She describes pain to the bottom of the left heel.  She states that she tries to walk 3 to 4 miles per day and walking to feel better.  Denies any recent injury or trauma to her feet denies any swelling or redness.  She denies any numbness or tingling and pain does not wake up at night.  She has no other concerns.   Review of Systems  All other systems reviewed and are negative.  Past Medical History:  Diagnosis Date  . Anemia    no current med.  . Arthritis    toes  . Complication of anesthesia    states is hard to wake up post-op  . Enlarged heart    no cardiologist, per pt.  . Hallux rigidus of left foot 09/2014  . Hypertension    states under control with med., has been on med. x 8 yr.  . Non-insulin dependent type 2 diabetes mellitus (HCC)   . Seasonal allergies   . Sinus headache     Past Surgical History:  Procedure Laterality Date  . ABDOMINAL HYSTERECTOMY     partial  . CERVIX LESION DESTRUCTION    . CHEILECTOMY  06/22/2011   Procedure: CHEILECTOMY;  Surgeon: Loreta Ave, MD;  Location: [facility];  Service: Orthopedics;  Laterality: Right;  right great toe hallux rigidus with cheilectomy 1st metatarsophalangeal  . CHEILECTOMY Left 09/24/2014   Procedure: LEFT FOOT HALLUX RIGIDUS WITH CHEILECTOMY GREAT TOE;  Surgeon: Mckinley Jewel, MD;  Location: [facility];  Service: Orthopedics;  Laterality: Left;  . COLONOSCOPY    . KNEE ARTHROSCOPY Left   . NASAL SEPTUM SURGERY       Current Outpatient Medications:  .  estradiol (VIVELLE-DOT) 0.025 MG/24HR, Place 1 patch onto the skin 2 (two) times a week., Disp: , Rfl:  .  meloxicam (MOBIC) 15 MG tablet, Take 1 tablet (15  mg total) by mouth daily., Disp: 30 tablet, Rfl: 2 .  metFORMIN (GLUCOPHAGE) 500 MG tablet, Take 500 mg by mouth daily., Disp: , Rfl:  .  valsartan-hydrochlorothiazide (DIOVAN-HCT) 80-12.5 MG per tablet, Take 1 tablet by mouth daily., Disp: , Rfl:   Allergies  Allergen Reactions  . Latex Rash         Objective:  Physical Exam  General: AAO x3, NAD  Dermatological: Skin is warm, dry and supple bilateral. Nails x 10 are well manicured; remaining integument appears unremarkable at this time. There are no open sores, no preulcerative lesions, no rash or signs of infection present.  Vascular: Dorsalis Pedis artery and Posterior Tibial artery pedal pulses are 2/4 bilateral with immedate capillary fill time. Pedal hair growth present. No varicosities and no lower extremity edema present bilateral. There is no pain with calf compression, swelling, warmth, erythema.   Neruologic: Grossly intact via light touch bilateral.  Protective threshold with Semmes Wienstein monofilament intact to all pedal sites bilateral.  Negative Tinel sign.  Musculoskeletal: There is tenderness palpation along the plantar medial tubercle of the calcaneus at the insertion of plantar fascia.  Mild discomfort on the medial band of plantar fascia the arch of the foot.  No pain with lateral compression  of calcaneus.  No pain on the course or insertion of the Achilles tendon.  There is no other areas of pinpoint bony tenderness or pain to vibratory sensation.  Muscular strength 5/5 in all groups tested bilateral.  Gait: Unassisted, Nonantalgic.       Assessment:   67 year old female left foot pain, plantar fasciitis    Plan:  -Treatment options discussed including all alternatives, risks, and complications -Etiology of symptoms were discussed -X-rays were obtained and reviewed with the patient.  No evidence of acute fracture or stress fracture identified today. -Prescribed mobic. Discussed side effects of the  medication and directed to stop if any are to occur and call the office.  -Declined steroid injection -Plantar fascial brace  -Stretching, icing daily -Discussed shoe modifications and orthotics  -Follow-up in 3 to 4 weeks if symptoms continue or sooner if any issues are to arise.  Vivi Barrack DPM

## 2017-12-10 NOTE — Patient Instructions (Signed)

## 2017-12-31 ENCOUNTER — Encounter: Payer: Self-pay | Admitting: Podiatry

## 2017-12-31 ENCOUNTER — Ambulatory Visit (INDEPENDENT_AMBULATORY_CARE_PROVIDER_SITE_OTHER): Payer: BLUE CROSS/BLUE SHIELD | Admitting: Podiatry

## 2017-12-31 DIAGNOSIS — M722 Plantar fascial fibromatosis: Secondary | ICD-10-CM | POA: Diagnosis not present

## 2017-12-31 MED ORDER — TRIAMCINOLONE ACETONIDE 10 MG/ML IJ SUSP
10.0000 mg | Freq: Once | INTRAMUSCULAR | Status: AC
  Administered 2017-12-31: 10 mg

## 2017-12-31 NOTE — Progress Notes (Signed)
Subjective: 67 year old female presents clinic today for follow-up evaluation of left heel pain, plantar fasciitis.  She states that she is better than what she was when I last saw her but she still getting some pain to the bottom of the left heel.  She has been stretching icing and was using a plantar fascial brace and she also has a night splint that she purchased.  Denies any recent injury or trauma or swelling.  She has no other concerns today. Denies any systemic complaints such as fevers, chills, nausea, vomiting. No acute changes since last appointment, and no other complaints at this time.   Objective: AAO x3, NAD DP/PT pulses palpable bilaterally, CRT less than 3 seconds There is improved however continued tenderness palpation on the plantar medial tubercle of the calcaneus at the insertion of plantar fashion the left side.  Plantar fascia appears to be intact.  Mild discomfort on the medial band of plantar fascia the arch of the foot.  No pain with lateral compression of the calcaneus.  No edema, erythema, increase in warmth.  No pain to the Achilles tendon.  No other areas of tenderness. No open lesions or pre-ulcerative lesions.  No pain with calf compression, swelling, warmth, erythema  Assessment: Left heel pain, plantar fasciitis  Plan: -All treatment options discussed with the patient including all alternatives, risks, complications.  -Steroid injection was performed.  See procedure note below. -Continue stretching, icing daily.  Continue with night splint was placed on a partial brace.  She has over-the-counter inserts inside of her shoes. -Patient encouraged to call the office with any questions, concerns, change in symptoms.   Procedure: Injection Tendon/Ligament Discussed alternatives, risks, complications and verbal consent was obtained.  Location: Left plantar fascia at the glabrous junction; medial approach. Skin Prep: Alcohol. Injectate: 0.5cc 0.5% marcaine plain, 0.5  cc 2% lidocaine plain and, 1 cc kenalog 10. Disposition: Patient tolerated procedure well. Injection site dressed with a band-aid.  Post-injection care was discussed and return precautions discussed.    Return in about 3 weeks (around 01/21/2018).  Vivi Barrack DPM

## 2018-01-21 ENCOUNTER — Ambulatory Visit (INDEPENDENT_AMBULATORY_CARE_PROVIDER_SITE_OTHER): Payer: BLUE CROSS/BLUE SHIELD | Admitting: Podiatry

## 2018-01-21 DIAGNOSIS — M722 Plantar fascial fibromatosis: Secondary | ICD-10-CM

## 2018-01-21 MED ORDER — TRIAMCINOLONE ACETONIDE 10 MG/ML IJ SUSP
10.0000 mg | Freq: Once | INTRAMUSCULAR | Status: AC
  Administered 2018-01-21: 10 mg

## 2018-01-22 ENCOUNTER — Telehealth: Payer: Self-pay | Admitting: Podiatry

## 2018-01-22 DIAGNOSIS — M722 Plantar fascial fibromatosis: Secondary | ICD-10-CM | POA: Insufficient documentation

## 2018-01-22 NOTE — Progress Notes (Signed)
Subjective: 67 year old female presents the office today for pain to the bottom of her left heel.  She says she is doing much better however she was in a lot of walking last week to try to get recurrence.  Is also asking for a night splint.  She did purchase an over-the-counter inserts for shoes but they are very flexible.  No recent injury or changes otherwise until last saw him.  enies any systemic complaints such as fevers, chills, nausea, vomiting. No acute changes since last appointment, and no other complaints at this time.   Objective: AAO x3, NAD DP/PT pulses palpable bilaterally, CRT less than 3 seconds There is improved.  Continued tenderness palpation of the plantar medial tubercle of the calcaneus at the insertion of plantar fascia on the left side.  Plantar fascia appears to be intact.  No pain with lateral compression of calcaneus.  Achilles tendon.  No other areas of tenderness.  Equinus is present. No open lesions or pre-ulcerative lesions.  No pain with calf compression, swelling, warmth, erythema  Assessment: Left plantar fasciitis, heel pain  Plan: -All treatment options discussed with the patient including all alternatives, risks, complications.  -Second steroid injection performed today.  See procedure note below. Night splint was also dispensed.  Check orthotic coverage and the information was given to [facility].  Discussed shoe modifications as well. -Patient encouraged to call the office with any questions, concerns, change in symptoms.   Procedure: Injection Tendon/Ligament Discussed alternatives, risks, complications and verbal consent was obtained.  Location: Left plantar fascia at the glabrous junction; medial approach. Skin Prep: Alcohol. Injectate: 0.5cc 0.5% marcaine plain, 0.5 cc 2% lidocaine plain and, 1 cc kenalog 10. Disposition: Patient tolerated procedure well. Injection site dressed with a band-aid.  Post-injection care was discussed and return  precautions discussed.   Vivi Barrack DPM

## 2018-01-22 NOTE — Telephone Encounter (Signed)
Left message for pt to call me to discuss orthotic coverage per Dr Ardelle Anton.

## 2018-01-22 NOTE — Telephone Encounter (Signed)
Pt returned call and they are covered at 80% after deductible( pt has not met it yet) so she would be responsible for 398.00 and I did explain if she decided to do this we could do half down and make payments if needed. Pt is going to decide and will discuss at next appt.

## 2018-02-21 ENCOUNTER — Ambulatory Visit: Payer: BLUE CROSS/BLUE SHIELD | Admitting: Podiatry

## 2018-02-21 DIAGNOSIS — M722 Plantar fascial fibromatosis: Secondary | ICD-10-CM | POA: Diagnosis not present

## 2018-02-21 NOTE — Progress Notes (Signed)
Subjective: 67 year old female presents the office today for evaluation of heel pain on the left side.  She presents today as she has a new pain which started again last week.  Also also saw her she states the pain went away but she was doing some leg exercises at the gym she felt the area stretch and she is had a recurrence of the pain to the bottom of the left heel she states that it feels the same as it did previously.  No recent injury that she can recall she denies any swelling or redness and numbness or tingling.  She would like to go to proceed with orthotics at this time.  She was to hold off on a steroid injection.  Since the pain recurs she is gone back to stretching, icing daily.  She has no other concerns at this time. Denies any systemic complaints such as fevers, chills, nausea, vomiting. No acute changes since last appointment, and no other complaints at this time.   Objective: AAO x3, NAD DP/PT pulses palpable bilaterally, CRT less than 3 seconds There is tenderness palpation on the plantar aspect of the left heel on the plantar medial tubercle of the calcaneus and insertion of plantar fascia.  Plantar fascia appears to be intact.  There is no pain with lateral compression of calcaneus there is no area pinpoint bony tenderness or pain to vibratory sensation.  There is no overlying edema, erythema, increase in warmth.  Negative Tinel sign.  No other areas of tenderness.  Decreased medial arch height.  Scar from prior bunion surgery is well-healed there is recurrence of the bunion. No open lesions or pre-ulcerative lesions.  No pain with calf compression, swelling, warmth, erythema  Assessment: Left plantar fasciitis with flatfoot  Plan: -All treatment options discussed with the patient including all alternatives, risks, complications.  -At this time she wants a steroid injection.  Want to get back to stretching, icing exercises daily.  Discussed shoe modifications as well as orthotics  and she was to proceed with custom orthotics today.  She was seen today by [name] and she was molded orthotics. -RTC 3 weeks with [name] to PUO or sooner if needed -Patient encouraged to call the office with any questions, concerns, change in symptoms.   Jennifer Ward DPM

## 2018-03-14 ENCOUNTER — Ambulatory Visit: Payer: BLUE CROSS/BLUE SHIELD | Admitting: Orthotics

## 2018-03-14 DIAGNOSIS — M722 Plantar fascial fibromatosis: Secondary | ICD-10-CM

## 2018-03-14 NOTE — Progress Notes (Signed)
Patient came in today to pick up custom made foot orthotics.  The goals were accomplished and the patient reported no dissatisfaction with said orthotics.  Patient was advised of breakin period and how to report any issues. 

## 2018-03-18 ENCOUNTER — Other Ambulatory Visit: Payer: Self-pay | Admitting: Podiatry

## 2018-04-19 ENCOUNTER — Other Ambulatory Visit: Payer: Self-pay | Admitting: Obstetrics & Gynecology

## 2018-04-19 DIAGNOSIS — N6489 Other specified disorders of breast: Secondary | ICD-10-CM

## 2018-04-25 ENCOUNTER — Ambulatory Visit
Admission: RE | Admit: 2018-04-25 | Discharge: 2018-04-25 | Disposition: A | Discharge status: Home / Self Care | Payer: BLUE CROSS/BLUE SHIELD | Source: Ambulatory Visit | Attending: Obstetrics & Gynecology | Admitting: Obstetrics & Gynecology

## 2018-04-25 DIAGNOSIS — N6489 Other specified disorders of breast: Secondary | ICD-10-CM

## 2018-09-11 ENCOUNTER — Emergency Department (HOSPITAL_COMMUNITY)
Admission: EM | Admit: 2018-09-11 | Discharge: 2018-09-12 | Disposition: A | Discharge status: Home / Self Care | Payer: BLUE CROSS/BLUE SHIELD | Attending: Emergency Medicine | Admitting: Emergency Medicine

## 2018-09-11 ENCOUNTER — Encounter (HOSPITAL_COMMUNITY): Payer: Self-pay | Admitting: Emergency Medicine

## 2018-09-11 ENCOUNTER — Emergency Department (HOSPITAL_COMMUNITY): Payer: BLUE CROSS/BLUE SHIELD

## 2018-09-11 ENCOUNTER — Other Ambulatory Visit: Payer: Self-pay

## 2018-09-11 DIAGNOSIS — E119 Type 2 diabetes mellitus without complications: Secondary | ICD-10-CM | POA: Insufficient documentation

## 2018-09-11 DIAGNOSIS — I480 Paroxysmal atrial fibrillation: Secondary | ICD-10-CM | POA: Diagnosis not present

## 2018-09-11 DIAGNOSIS — I10 Essential (primary) hypertension: Secondary | ICD-10-CM | POA: Insufficient documentation

## 2018-09-11 DIAGNOSIS — M25512 Pain in left shoulder: Secondary | ICD-10-CM | POA: Diagnosis present

## 2018-09-11 DIAGNOSIS — Z7984 Long term (current) use of oral hypoglycemic drugs: Secondary | ICD-10-CM | POA: Diagnosis not present

## 2018-09-11 LAB — CBC WITH DIFFERENTIAL/PLATELET
Abs Immature Granulocytes: 0.05 10*3/uL (ref 0.00–0.07)
BASOS PCT: 1 %
Basophils Absolute: 0.1 10*3/uL (ref 0.0–0.1)
EOS ABS: 0.2 10*3/uL (ref 0.0–0.5)
Eosinophils Relative: 2 %
HCT: 41.1 % (ref 36.0–46.0)
Hemoglobin: 13.9 g/dL (ref 12.0–15.0)
Immature Granulocytes: 1 %
Lymphocytes Relative: 51 %
Lymphs Abs: 5.5 10*3/uL — ABNORMAL HIGH (ref 0.7–4.0)
MCH: 32 pg (ref 26.0–34.0)
MCHC: 33.8 g/dL (ref 30.0–36.0)
MCV: 94.5 fL (ref 80.0–100.0)
Monocytes Absolute: 0.7 10*3/uL (ref 0.1–1.0)
Monocytes Relative: 7 %
NEUTROS PCT: 38 %
Neutro Abs: 3.9 10*3/uL (ref 1.7–7.7)
Platelets: 307 10*3/uL (ref 150–400)
RBC: 4.35 MIL/uL (ref 3.87–5.11)
RDW: 13.5 % (ref 11.5–15.5)
WBC: 10.4 10*3/uL (ref 4.0–10.5)
nRBC: 0 % (ref 0.0–0.2)

## 2018-09-11 LAB — COMPREHENSIVE METABOLIC PANEL
ALK PHOS: 74 U/L (ref 38–126)
ALT: 23 U/L (ref 0–44)
AST: 27 U/L (ref 15–41)
Albumin: 4.2 g/dL (ref 3.5–5.0)
Anion gap: 15 (ref 5–15)
BILIRUBIN TOTAL: 0.3 mg/dL (ref 0.3–1.2)
BUN: 24 mg/dL — AB (ref 8–23)
CO2: 19 mmol/L — ABNORMAL LOW (ref 22–32)
Calcium: 9.9 mg/dL (ref 8.9–10.3)
Chloride: 103 mmol/L (ref 98–111)
Creatinine, Ser: 1.12 mg/dL — ABNORMAL HIGH (ref 0.44–1.00)
GFR calc Af Amer: 59 mL/min — ABNORMAL LOW (ref >60)
GFR calc non Af Amer: 51 mL/min — ABNORMAL LOW (ref >60)
GLUCOSE: 125 mg/dL — AB (ref 70–99)
POTASSIUM: 3.5 mmol/L (ref 3.5–5.1)
Sodium: 137 mmol/L (ref 135–145)
TOTAL PROTEIN: 7.4 g/dL (ref 6.5–8.1)

## 2018-09-11 LAB — I-STAT TROPONIN, ED: Troponin i, poc: 0.01 ng/mL (ref 0.00–0.08)

## 2018-09-11 LAB — D-DIMER, QUANTITATIVE (NOT AT ARMC): D DIMER QUANT: 1.34 ug{FEU}/mL — AB (ref 0.00–0.50)

## 2018-09-11 MED ORDER — METOPROLOL TARTRATE 5 MG/5ML IV SOLN
5.0000 mg | Freq: Once | INTRAVENOUS | Status: AC
  Administered 2018-09-11: 5 mg via INTRAVENOUS
  Filled 2018-09-11: qty 5

## 2018-09-11 MED ORDER — SODIUM CHLORIDE 0.9 % IV BOLUS
1000.0000 mL | Freq: Once | INTRAVENOUS | Status: AC
Start: 2018-09-11 — End: 2018-09-11
  Administered 2018-09-11: 1000 mL via INTRAVENOUS

## 2018-09-11 MED ORDER — FENTANYL CITRATE (PF) 100 MCG/2ML IJ SOLN
25.0000 ug | Freq: Once | INTRAMUSCULAR | Status: AC
Start: 2018-09-11 — End: 2018-09-11
  Administered 2018-09-11: 25 ug via INTRAVENOUS
  Filled 2018-09-11: qty 2

## 2018-09-11 NOTE — ED Triage Notes (Signed)
Pt arrives to ED from her PCP with complaints of left shoulder pain since since Sunday. Pt went to Urgent Care on Sunday and was discharged on prednisone. Pt followed up with her PCP today and a EKG showed Afib. Pt still complains of left shoulder pain. Pt received 324 ASA.

## 2018-09-11 NOTE — ED Provider Notes (Addendum)
[facility] EMERGENCY DEPARTMENT Provider Note   CSN: [id number] Arrival date & time: 09/11/18  1753    History   Chief Complaint Chief Complaint  Patient presents with  . Shoulder Pain  . Irregular Heart Beat    HPI Jennifer Ward is a 68 y.o. female.     68 y/o female with a PMH of DMT2 presents to the ED with a chief complaint of left shoulder pain x 3 days. Patient reports when she was at church on Sunday she began feeling a shooting stabbing pain to her left arm. She was seen at urgent care where she was diagnosed with muscle spasms and prescribed muscle relaxers to help with her symptoms.  Reports being seen by her PCP today as the pain on her left arm was worsening, she describes it as feeling like she is has some frostbite to her left hand.  Her PCP did an EKG in the office which showed patient was in A. fib, she denies any previous history of A. fib or being on any anticoagulation or antiarrhythmic medication.  Reports the pain is intermittent, better with extending of her arm.  Patient denies any previous cardiac history, reports her mom died likely of a heart attack but it was unknown.  She denies any chest pain, shortness of breath, fever, blood thinner use, weakness.  He denies any previous history of blood clots, reports she is currently taking an estrogen patch, denies any recent travel.      Past Medical History:  Diagnosis Date  . Anemia    no current med.  . Arthritis    toes  . Complication of anesthesia    states is hard to wake up post-op  . Enlarged heart    no cardiologist, per pt.  . Hallux rigidus of left foot 09/2014  . Hypertension    states under control with med., has been on med. x 8 yr.  . Non-insulin dependent type 2 diabetes mellitus (HCC)   . Seasonal allergies   . Sinus headache     Patient Active Problem List   Diagnosis Date Noted  . Plantar fasciitis, left 01/22/2018    Past Surgical History:  Procedure Laterality  Date  . ABDOMINAL HYSTERECTOMY     partial  . CERVIX LESION DESTRUCTION    . CHEILECTOMY  06/22/2011   Procedure: CHEILECTOMY;  Surgeon: Loreta Ave, MD;  Location: [facility];  Service: Orthopedics;  Laterality: Right;  right great toe hallux rigidus with cheilectomy 1st metatarsophalangeal  . CHEILECTOMY Left 09/24/2014   Procedure: LEFT FOOT HALLUX RIGIDUS WITH CHEILECTOMY GREAT TOE;  Surgeon: Mckinley Jewel, MD;  Location: [facility];  Service: Orthopedics;  Laterality: Left;  . COLONOSCOPY    . KNEE ARTHROSCOPY Left   . NASAL SEPTUM SURGERY       OB History   No obstetric history on file.      Home Medications    Prior to Admission medications   Medication Sig Start Date End Date Taking? Authorizing Provider  allopurinol (ZYLOPRIM) 300 MG tablet Take 300 mg by mouth daily. 12/12/17  Yes [provider]  estradiol (VIVELLE-DOT) 0.025 MG/24HR Place 1 patch onto the skin 2 (two) times a week. On Tuesday and Friday   Yes [provider]  loratadine (CLARITIN) 10 MG tablet Take 10 mg by mouth daily. 11/16/17  Yes [provider]  losartan (COZAAR) 25 MG tablet Take 25 mg by mouth daily.  12/25/17  Yes [provider]  metFORMIN (GLUCOPHAGE) 500 MG tablet Take 500 mg by mouth daily.   Yes [provider]  sitaGLIPtin (JANUVIA) 50 MG tablet Take 50 mg by mouth daily.   Yes [provider]  apixaban (ELIQUIS) 5 MG TABS tablet Take 1 tablet (5 mg total) by mouth 2 (two) times daily for 30 days. 09/12/18 10/12/18  Claude Manges, PA-C  meloxicam (MOBIC) 15 MG tablet TAKE 1 TABLET BY MOUTH EVERY DAY Patient not taking: Reported on 09/12/2018 03/19/18   Vivi Barrack, DPM  metoprolol tartrate (LOPRESSOR) 25 MG tablet Take 0.5 tablets (12.5 mg total) by mouth 2 (two) times daily for 30 days. 09/12/18 10/12/18  Claude Manges, PA-C    Family History History reviewed. No pertinent family history.  Social  History Social History   Tobacco Use  . Smoking status: Never Smoker  . Smokeless tobacco: Never Used  Substance Use Topics  . Alcohol use: No  . Drug use: No     Allergies   Latex   Review of Systems Review of Systems  Constitutional: Negative for chills and fever.  HENT: Negative for ear pain and sore throat.   Eyes: Negative for pain and visual disturbance.  Respiratory: Negative for cough and shortness of breath.   Cardiovascular: Negative for chest pain and palpitations.  Gastrointestinal: Negative for abdominal pain and vomiting.  Genitourinary: Negative for dysuria and hematuria.  Musculoskeletal: Positive for arthralgias and myalgias. Negative for back pain.  Skin: Negative for color change and rash.  Neurological: Negative for seizures, syncope, facial asymmetry, light-headedness and headaches.  All other systems reviewed and are negative.    Physical Exam Updated Vital Signs BP 105/63   Pulse 63   Temp 98.3 F (36.8 C) (Oral)   Resp 15   Ht 5\' 4"  (1.626 m)   Wt 90.7 kg   SpO2 99%   BMI 34.33 kg/m   Physical Exam Vitals signs and nursing note reviewed.  Constitutional:      General: She is not in acute distress.    Appearance: She is well-developed.  HENT:     Head: Normocephalic and atraumatic.     Mouth/Throat:     Pharynx: No oropharyngeal exudate.  Eyes:     Pupils: Pupils are equal, round, and reactive to light.  Neck:     Musculoskeletal: Normal range of motion.  Cardiovascular:     Rate and Rhythm: Regular rhythm.     Heart sounds: Normal heart sounds.  Pulmonary:     Effort: Pulmonary effort is normal. No respiratory distress.     Breath sounds: Normal breath sounds. No decreased breath sounds, wheezing or rhonchi.  Abdominal:     General: Bowel sounds are normal. There is no distension.     Palpations: Abdomen is soft.     Tenderness: There is no abdominal tenderness. There is no right CVA tenderness or left CVA tenderness.   Musculoskeletal:        General: No tenderness or deformity.     Right lower leg: No edema.     Left lower leg: No edema.     Comments: No tenderness to palpation along humeral head.  No tenderness to palpation along elbow joint.  Pulses present, capillary refill is intact.  Strength is slightly diminished on left hand, patient reports this is due to feeling of numbness on her left arm.  Skin:    General: Skin is warm and dry.  Neurological:     Mental  Status: She is alert and oriented to person, place, and time.      ED Treatments / Results  Labs (all labs ordered are listed, but only abnormal results are displayed) Labs Reviewed  CBC WITH DIFFERENTIAL/PLATELET - Abnormal; Notable for the following components:      Result Value   Lymphs Abs 5.5 (*)    All other components within normal limits  COMPREHENSIVE METABOLIC PANEL - Abnormal; Notable for the following components:   CO2 19 (*)    Glucose, Bld 125 (*)    BUN 24 (*)    Creatinine, Ser 1.12 (*)    GFR calc non Af Amer 51 (*)    GFR calc Af Amer 59 (*)    All other components within normal limits  D-DIMER, QUANTITATIVE (NOT AT [facility]) - Abnormal; Notable for the following components:   D-Dimer, Quant 1.34 (*)    All other components within normal limits  I-STAT TROPONIN, ED    EKG EKG Interpretation  Date/Time:  Wednesday September 11 2018 18:58:15 EST Ventricular Rate:  139 PR Interval:    QRS Duration: 83 QT Interval:  311 QTC Calculation: 473 R Axis:   -49 Text Interpretation:  Supraventricular tachycardia ?afib Confirmed by Margarita Grizzle [phone]) on 09/11/2018 7:28:25 PM   Radiology No results found.  Procedures Procedures (including critical care time)  Medications Ordered in ED Medications  metoprolol tartrate (LOPRESSOR) injection 5 mg (5 mg Intravenous Given 09/11/18 1929)  fentaNYL (SUBLIMAZE) injection 25 mcg (25 mcg Intravenous Given 09/11/18 2115)  sodium chloride 0.9 % bolus 1,000 mL (0 mLs Intravenous  Stopped 09/11/18 2248)     Initial Impression / Assessment and Plan / ED Course  I have reviewed the triage vital signs and the nursing notes.  Pertinent labs & imaging results that were available during my care of the patient were reviewed by me and considered in my medical decision making (see chart for details).    Patient with a PMH of DMT2 presents to the ED with left shoulder pain along with new onset Afib. Patient reports her pain started on "Sunday while at church, states she was seen at urgent care and diagnosed with likely a pulled muscle.  She was prescribed muscle relaxers were reports the pain did not improve.  Seen by PCP earlier who obtain an EKG and noticed patient was in A.fib.  She denies any previous history of atrial fibrillation, currently not anticoagulated or on any antiarrhythmic. Lopressor was order for patient due to irregularly irregular rhythm, seems to improved however patient going in and out of rhythm.  CBC showed no leukocytosis, hemoglobin is stable. CMP showed slight elevation in creatine patient provided with fluids. First troponin was negative. DG Chest showed no acute abnormality.  D-dimer was ordered due to patient's complaints, elevated placed order for ultrasound of left arm which will be done in the morning pending patients disposition. No shortness of breath, no chest pain, no hypoxia low suspicion for PE. Unable to perform at this time due to timing. Will place call to hospitalist for admission.     10" :47 PM Spoke to Dr. Kirtland Bouchard who advised cardiology consult, called placed to cardiology. 11:32 PM Spoke to cardiology who will evaluate patient in the ED and further assess. 11:37 PM Spoke to cardiology who will evaluate patient in the ED.   Cardiology recommended patient be started Metroprolol tartrate along with Eliquis.  Patient is to follow-up with cardiology as needed.   CHA2DS2/VAS Stroke Risk Points  Current  as of today     4 >= 2 Points: High Risk  1 -  1.99 Points: Medium Risk  0 Points: Low Risk    The patient's score has not changed in the past year.:  No Change     Details    This score determines the patient's risk of having a stroke if the  patient has atrial fibrillation.       Points Metrics  0 Has Congestive Heart Failure:  No    Current as of today  0 Has Vascular Disease:  No    Current as of today  1 Has Hypertension:  Yes     Current as of today  1 Age:  70    Current as of today  1 Has Diabetes:  Yes     Current as of today  0 Had Stroke:  No  Had TIA:  No  Had thromboembolism:  No    Current as of today  1 Female:  Yes    Current as of today           Final Clinical Impressions(s) / ED Diagnoses   Final diagnoses:  Paroxysmal atrial fibrillation [facility])    ED Discharge Orders         Ordered    UE VENOUS DUPLEX  Status:  Canceled     09/12/18 0017    apixaban (ELIQUIS) 5 MG TABS tablet  2 times daily     09/12/18 0039    metoprolol tartrate (LOPRESSOR) 25 MG tablet  2 times daily     09/12/18 0039           Claude Manges, PA-C 09/12/18 0041    Claude Manges, PA-C 09/18/18 1506    Margarita Grizzle, MD 09/19/18 1719

## 2018-09-12 MED ORDER — APIXABAN 5 MG PO TABS: 5.0000 mg | ORAL_TABLET | Freq: Two times a day (BID) | ORAL | 0 refills | Status: DC

## 2018-09-12 MED ORDER — METOPROLOL TARTRATE 25 MG PO TABS: 12.5000 mg | ORAL_TABLET | Freq: Two times a day (BID) | ORAL | 0 refills | Status: DC

## 2018-09-12 NOTE — Discharge Instructions (Addendum)
I have prescribed medication to help with your heart rate please take this medication as directed. Please follow up with cardiology at your earliest convenience.  If you experience any chest pain, shortness of breath please return to the ED for reevaluation.

## 2018-09-19 ENCOUNTER — Encounter (HOSPITAL_COMMUNITY): Payer: Self-pay | Admitting: Physician Assistant

## 2018-09-19 ENCOUNTER — Other Ambulatory Visit: Payer: Self-pay

## 2018-09-19 ENCOUNTER — Ambulatory Visit (HOSPITAL_COMMUNITY)
Admission: RE | Admit: 2018-09-19 | Discharge: 2018-09-19 | Disposition: A | Discharge status: Home / Self Care | Payer: BLUE CROSS/BLUE SHIELD | Source: Ambulatory Visit | Attending: Physician Assistant | Admitting: Physician Assistant

## 2018-09-19 ENCOUNTER — Other Ambulatory Visit (HOSPITAL_COMMUNITY): Payer: Self-pay | Admitting: *Deleted

## 2018-09-19 VITALS — BP 100/58 | HR 64 | Ht 64.0 in | Wt 206.0 lb

## 2018-09-19 DIAGNOSIS — E669 Obesity, unspecified: Secondary | ICD-10-CM | POA: Insufficient documentation

## 2018-09-19 DIAGNOSIS — I10 Essential (primary) hypertension: Secondary | ICD-10-CM | POA: Diagnosis not present

## 2018-09-19 DIAGNOSIS — I480 Paroxysmal atrial fibrillation: Secondary | ICD-10-CM

## 2018-09-19 DIAGNOSIS — Z7901 Long term (current) use of anticoagulants: Secondary | ICD-10-CM | POA: Insufficient documentation

## 2018-09-19 DIAGNOSIS — Z6835 Body mass index (BMI) 35.0-35.9, adult: Secondary | ICD-10-CM | POA: Insufficient documentation

## 2018-09-19 DIAGNOSIS — E119 Type 2 diabetes mellitus without complications: Secondary | ICD-10-CM | POA: Diagnosis not present

## 2018-09-19 DIAGNOSIS — Z7984 Long term (current) use of oral hypoglycemic drugs: Secondary | ICD-10-CM | POA: Diagnosis not present

## 2018-09-19 DIAGNOSIS — Z79899 Other long term (current) drug therapy: Secondary | ICD-10-CM | POA: Insufficient documentation

## 2018-09-19 DIAGNOSIS — D649 Anemia, unspecified: Secondary | ICD-10-CM | POA: Diagnosis not present

## 2018-09-19 MED ORDER — METOPROLOL SUCCINATE ER 25 MG PO TB24: 12.5000 mg | ORAL_TABLET | Freq: Every day | ORAL | 3 refills | Status: DC

## 2018-09-19 MED ORDER — APIXABAN 5 MG PO TABS: 5.0000 mg | ORAL_TABLET | Freq: Two times a day (BID) | ORAL | 3 refills | Status: DC

## 2018-09-19 NOTE — Progress Notes (Signed)
Primary Care Physician: [name], MD Referring Physician: [facility]   Jennifer Ward is a 68 y.o. female with a history of DM, HTN, and paroxysmal atrial fibrillation who presents for consultation in the [facility].  The patient was initially diagnosed with atrial fibrillation at her PCP office after present for shoulder pain (ECG not available for review). Irregular heart rate found incidentally. She was evaluated in the ER and started on metoprolol and Eliquis. She is in SR today. She was unaware of her arrhythmia.   Today, she denies symptoms of palpitations, chest pain, shortness of breath, orthopnea, PND, lower extremity edema, dizziness, presyncope, syncope, snoring, daytime somnolence, bleeding, or neurologic sequela. The patient is tolerating medications without difficulties and is otherwise without complaint today.    Atrial Fibrillation Risk Factors:  she does not have symptoms or diagnosis of sleep apnea. she does not have a history of rheumatic fever. she does not have a history of alcohol use. The patient does not have a history of early familial atrial fibrillation or other arrhythmias.  she has a BMI of Body mass index is 35.36 kg/m.Marland Kitchen Filed Weights   09/19/18 1026  Weight: 93.4 kg    No family history on file.   Atrial Fibrillation Management history:  Previous antiarrhythmic drugs: none Previous cardioversions: none Previous ablations: none CHADS2VASC score: 61 (age, female, DM, HTN) Anticoagulation history: Eliquis   Past Medical History:  Diagnosis Date  . Anemia    no current med.  . Arthritis    toes  . Complication of anesthesia    states is hard to wake up post-op  . Enlarged heart    no cardiologist, per pt.  . Hallux rigidus of left foot 09/2014  . Hypertension    states under control with med., has been on med. x 8 yr.  . Non-insulin dependent type 2 diabetes mellitus (HCC)   . Seasonal allergies   . Sinus  headache    Past Surgical History:  Procedure Laterality Date  . ABDOMINAL HYSTERECTOMY     partial  . CERVIX LESION DESTRUCTION    . CHEILECTOMY  06/22/2011   Procedure: CHEILECTOMY;  Surgeon: Loreta Ave, MD;  Location: [facility];  Service: Orthopedics;  Laterality: Right;  right great toe hallux rigidus with cheilectomy 1st metatarsophalangeal  . CHEILECTOMY Left 09/24/2014   Procedure: LEFT FOOT HALLUX RIGIDUS WITH CHEILECTOMY GREAT TOE;  Surgeon: Mckinley Jewel, MD;  Location: [facility];  Service: Orthopedics;  Laterality: Left;  . COLONOSCOPY    . KNEE ARTHROSCOPY Left   . NASAL SEPTUM SURGERY      Current Outpatient Medications  Medication Sig Dispense Refill  . allopurinol (ZYLOPRIM) 300 MG tablet Take 300 mg by mouth daily.  3  . apixaban (ELIQUIS) 5 MG TABS tablet Take 1 tablet (5 mg total) by mouth 2 (two) times daily. 60 tablet 3  . estradiol (VIVELLE-DOT) 0.025 MG/24HR Place 1 patch onto the skin 2 (two) times a week. On Tuesday and Friday    . loratadine (CLARITIN) 10 MG tablet Take 10 mg by mouth daily.  2  . losartan (COZAAR) 25 MG tablet Take 25 mg by mouth daily.   2  . metFORMIN (GLUCOPHAGE) 500 MG tablet Take 500 mg by mouth daily.    . metoprolol succinate (TOPROL XL) 25 MG 24 hr tablet Take 0.5 tablets (12.5 mg total) by mouth at bedtime. 15 tablet 3  . sitaGLIPtin (JANUVIA)  50 MG tablet Take 50 mg by mouth daily.     No current facility-administered medications for this encounter.     Allergies  Allergen Reactions  . Latex Rash    Social History   Socioeconomic History  . Marital status: Married    Spouse name: Not on file  . Number of children: Not on file  . Years of education: Not on file  . Highest education level: Not on file  Occupational History  . Not on file  Social Needs  . Financial resource strain: Not on file  . Food insecurity:    Worry: Not on file    Inability: Not on file  . Transportation  needs:    Medical: Not on file    Non-medical: Not on file  Tobacco Use  . Smoking status: Never Smoker  . Smokeless tobacco: Never Used  Substance and Sexual Activity  . Alcohol use: No  . Drug use: No  . Sexual activity: Not on file  Lifestyle  . Physical activity:    Days per week: Not on file    Minutes per session: Not on file  . Stress: Not on file  Relationships  . Social connections:    Talks on phone: Not on file    Gets together: Not on file    Attends religious service: Not on file    Active member of club or organization: Not on file    Attends meetings of clubs or organizations: Not on file    Relationship status: Not on file  . Intimate partner violence:    Fear of current or ex partner: Not on file    Emotionally abused: Not on file    Physically abused: Not on file    Forced sexual activity: Not on file  Other Topics Concern  . Not on file  Social History Narrative  . Not on file     ROS- All systems are reviewed and negative except as per the HPI above.  Physical Exam: Vitals:   09/19/18 1026  BP: (!) 100/58  Pulse: 64  Weight: 93.4 kg  Height: 5\' 4"  (1.626 m)    GEN- The patient is well appearing, alert and oriented x 3 today.   Head- normocephalic, atraumatic Eyes-  Sclera clear, conjunctiva pink Ears- hearing intact Oropharynx- clear Neck- supple  Lungs- Clear to ausculation bilaterally, normal work of breathing Heart- Regular rate and rhythm, no murmurs, rubs or gallops  GI- soft, NT, ND, + BS Extremities- no clubbing, cyanosis, or edema MS- no significant deformity or atrophy Skin- no rash or lesion Psych- euthymic mood, full affect Neuro- strength and sensation are intact  Wt Readings from Last 3 Encounters:  09/19/18 93.4 kg  09/11/18 90.7 kg  09/24/14 97.5 kg    EKG today demonstrates SR HR 64, PAC, LAD, PR 172, QRS 82, QTc 449  Epic records are reviewed at length today  Assessment and Plan:  1. Paroxysmal atrial  fibrillation The patient has paroxysmal atrial fibrillation. Found incidentally at PCP office. Patient was unaware of her arrhythmia. In SR today. Will check echocardiogram Change Lopressor to Toprol 12.5 mg daily to see if this helps her fatigue. Continue Eliquis 5 mg BID Check CBC/Bmet/TSH on f/u  This patients CHA2DS2-VASc Score and unadjusted Ischemic Stroke Rate (% per year) is equal to 4.8 % stroke rate/year from a score of 4  Above score calculated as 1 point each if present [CHF, HTN, DM, Vascular=MI/PAD/Aortic Plaque, Age if 39-74, or Female]  Above score calculated as 2 points each if present [Age > 75, or Stroke/TIA/TE]   2. Obesity Body mass index is 35.36 kg/m. Lifestyle modification was discussed at length including regular exercise and weight reduction.  3. HTN Borderline low today. Medication changes as above.   Follow up in the Afib Clinic in one month.  [name] PA-C [facility] [address] [place], [place] [address] [phone] 09/19/2018 1:14 PM

## 2018-10-07 ENCOUNTER — Telehealth (HOSPITAL_COMMUNITY): Payer: Self-pay | Admitting: *Deleted

## 2018-10-07 NOTE — Telephone Encounter (Signed)
Patient called in stating she is still very fatigued on metoprolol. Even with change to extended release she doesn't have any energy and feels foggy. Her BP does run in the 100/50 range at the orthopedic doc today. Offered to speak with [name], PA to change from beta blocker to different medication but patient decided she would wait until her follow up 4/9 to discuss over phone visit. Pt instructed to call me back if she wants to change prior to the appointment. Pt in agreement.

## 2018-10-10 ENCOUNTER — Ambulatory Visit (HOSPITAL_COMMUNITY): Payer: BLUE CROSS/BLUE SHIELD

## 2018-10-17 ENCOUNTER — Other Ambulatory Visit (HOSPITAL_COMMUNITY): Payer: Self-pay | Admitting: *Deleted

## 2018-10-17 ENCOUNTER — Ambulatory Visit (HOSPITAL_COMMUNITY)
Admission: RE | Admit: 2018-10-17 | Discharge: 2018-10-17 | Disposition: A | Discharge status: Home / Self Care | Payer: BLUE CROSS/BLUE SHIELD | Source: Ambulatory Visit | Attending: Physician Assistant | Admitting: Physician Assistant

## 2018-10-17 ENCOUNTER — Other Ambulatory Visit: Payer: Self-pay

## 2018-10-17 DIAGNOSIS — I480 Paroxysmal atrial fibrillation: Secondary | ICD-10-CM

## 2018-10-17 NOTE — Progress Notes (Signed)
Electrophysiology TeleHealth Note   Due to national recommendations of social distancing due to COVID 19, Audio telehealth visit is felt to be most appropriate for this patient at this time.  See consent below from today for patient consent regarding telehealth for the Atrial Fibrillation Clinic. Consent obtained verbally.   Date:  10/17/2018   ID:  Jennifer Ward, DOB 07/06/1951, MRN [id number]  Location: home  Provider location: [address] [place], [place] [address] Evaluation Performed: Follow up  PCP:  [name], MD   CC: Follow up for atrial fibrillation   History of Present Illness: Jennifer Ward is a 68 y.o. female who presents via audio conferencing for a telehealth visit today. Patient reports that she continues to have symptoms of fatigue although this has improved since decreasing BB. She wears a FitBit and notes that several nights per week she has heart racing up to 130 bpm during rest. Her heart rate during the day is controlled 60s-70s typically with up to 110 with activity per her FitBit. Possible she is have paroxysms of afib contributing to her fatigue. She has no other symptoms.  Today, she denies symptoms of palpitations, chest pain, shortness of breath, orthopnea, PND, lower extremity edema, claudication, dizziness, presyncope, syncope, bleeding, or neurologic sequela. The patient is tolerating medications without difficulties and is otherwise without complaint today.   she denies symptoms of cough, fevers, chills, or new SOB worrisome for COVID 19. +fatigue.   Atrial Fibrillation Risk Factors:  she does not have symptoms or diagnosis of sleep apnea. she does not have a history of rheumatic fever. she does not have a history of alcohol use. The patient does not have a history of early familial atrial fibrillation or other arrhythmias.  she has a BMI of There is no height or weight on file to calculate BMI..  BP 110/72 Pulse 61 Provided by patient with home BP  measurements.   Past Medical History:  Diagnosis Date   Anemia    no current med.   Arthritis    toes   Complication of anesthesia    states is hard to wake up post-op   Enlarged heart    no cardiologist, per pt.   Hallux rigidus of left foot 09/2014   Hypertension    states under control with med., has been on med. x 8 yr.   Non-insulin dependent type 2 diabetes mellitus (HCC)    Seasonal allergies    Sinus headache    Past Surgical History:  Procedure Laterality Date   ABDOMINAL HYSTERECTOMY     partial   CERVIX LESION DESTRUCTION     CHEILECTOMY  06/22/2011   Procedure: CHEILECTOMY;  Surgeon: Loreta Ave, MD;  Location: [facility];  Service: Orthopedics;  Laterality: Right;  right great toe hallux rigidus with cheilectomy 1st metatarsophalangeal   CHEILECTOMY Left 09/24/2014   Procedure: LEFT FOOT HALLUX RIGIDUS WITH CHEILECTOMY GREAT TOE;  Surgeon: Mckinley Jewel, MD;  Location: [facility];  Service: Orthopedics;  Laterality: Left;   COLONOSCOPY     KNEE ARTHROSCOPY Left    NASAL SEPTUM SURGERY       Current Outpatient Medications  Medication Sig Dispense Refill   allopurinol (ZYLOPRIM) 300 MG tablet Take 300 mg by mouth daily.  3   apixaban (ELIQUIS) 5 MG TABS tablet Take 1 tablet (5 mg total) by mouth 2 (two) times daily. 60 tablet 3   estradiol (VIVELLE-DOT) 0.025 MG/24HR Place 1 patch onto  the skin 2 (two) times a week. On Tuesday and Friday     loratadine (CLARITIN) 10 MG tablet Take 10 mg by mouth daily.  2   losartan (COZAAR) 25 MG tablet Take 25 mg by mouth daily.   2   metFORMIN (GLUCOPHAGE) 500 MG tablet Take 500 mg by mouth daily.     metoprolol succinate (TOPROL XL) 25 MG 24 hr tablet Take 0.5 tablets (12.5 mg total) by mouth at bedtime. 15 tablet 3   sitaGLIPtin (JANUVIA) 50 MG tablet Take 50 mg by mouth daily.     No current facility-administered medications for this encounter.     Allergies:    Latex   Social History:  The patient  reports that she has never smoked. She has never used smokeless tobacco. She reports that she does not drink alcohol or use drugs.   Family History:  The patient's  family history is not on file.    ROS:  Please see the history of present illness.   All other systems are personally reviewed and negative.   Recent Labs: 09/11/2018: ALT 23; BUN 24; Creatinine, Ser 1.12; Hemoglobin 13.9; Platelets 307; Potassium 3.5; Sodium 137  personally reviewed    Other studies personally reviewed: Additional studies/ records that were reviewed today include: Epic notes    ASSESSMENT AND PLAN:  1. Paroxysmal atrial fibrillation Patient continues to have fatigue and possible heart racing episodes. Will order 7 day Zio patch to further clarify heart racing. Continue Toprol 12.5 mg daily. We did discuss possibly changing BB to diltiazem to help with fatigue but we will wait until we have results of heart monitor. Continue Eliquis 5 mg daily Check CBC/Bmet/TSH  This patients CHA2DS2-VASc Score and unadjusted Ischemic Stroke Rate (% per year) is equal to 4.8 % stroke rate/year from a score of 4  Above score calculated as 1 point each if present [CHF, HTN, DM, Vascular=MI/PAD/Aortic Plaque, Age if 65-74, or Female] Above score calculated as 2 points each if present [Age > 75, or Stroke/TIA/TE]  2. Obesity Lifestyle modification was discussed and encouraged including regular physical activity and weight reduction.  3. HTN Stable, no changes today.   COVID screen The patient does not have any symptoms that suggest any further testing/ screening at this time.  Social distancing reinforced today.    Follow-up for telehealth visit in AF clinic in 4 weeks.   Current medicines are reviewed at length with the patient today.   The patient does not have concerns regarding her medicines.  The following changes were made today:  none  Labs/ tests ordered today  include: ZIO patch, CBC. TSH, Bmet   Patient Risk:  after full review of this patients clinical status, I feel that they are at moderate risk at this time.   Today, I have spent 26 minutes with the patient with telehealth technology discussing atrial fibrillation, Zio patch, and COVID-19 precautions.    Dalia Heading PA-C 10/17/2018 9:54 AM  [facility] [address] [place], [place] [address] [phone]   I hereby voluntarily request, consent and authorize the Atrial Fibrillation Clinic and its employed or contracted physicians, physician assistants, nurse practitioners or other licensed health care professionals (the Practitioner), to provide me with telemedicine health care services (the "Services") as deemed necessary by the treating Practitioner. I acknowledge and consent to receive the Services by the Practitioner via telemedicine. I understand that the telemedicine visit will involve communicating with the Practitioner through live audiovisual  Quarry manager and the disclosure of certain medical information by electronic transmission. I acknowledge that I have been given the opportunity to request an in-person assessment or other available alternative prior to the telemedicine visit and am voluntarily participating in the telemedicine visit.   I understand that I have the right to withhold or withdraw my consent to the use of telemedicine in the course of my care at any time, without affecting my right to future care or treatment, and that the Practitioner or I may terminate the telemedicine visit at any time. I understand that I have the right to inspect all information obtained and/or recorded in the course of the telemedicine visit and may receive copies of available information for a reasonable fee.  I understand that some of the potential risks of receiving the Services via telemedicine include:   Delay or interruption in medical evaluation  due to technological equipment failure or disruption;  Information transmitted may not be sufficient (e.g. poor resolution of images) to allow for appropriate medical decision making by the Practitioner; and/or  In rare instances, security protocols could fail, causing a breach of personal health information.   Furthermore, I acknowledge that it is my responsibility to provide information about my medical history, conditions and care that is complete and accurate to the best of my ability. I acknowledge that Practitioner's advice, recommendations, and/or decision may be based on factors not within their control, such as incomplete or inaccurate data provided by me or distortions of diagnostic images or specimens that may result from electronic transmissions. I understand that the practice of medicine is not an exact science and that Practitioner makes no warranties or guarantees regarding treatment outcomes. I acknowledge that I will receive a copy of this consent concurrently upon execution via email to the email address I last provided but may also request a printed copy by calling the office of the Atrial Fibrillation Clinic.  I understand that my insurance will be billed for this visit.   I have read or had this consent read to me.  I understand the contents of this consent, which adequately explains the benefits and risks of the Services being provided via telemedicine.  I have been provided ample opportunity to ask questions regarding this consent and the Services and have had my questions answered to my satisfaction.  I give my informed consent for the services to be provided through the use of telemedicine in my medical care  By participating in this telemedicine visit I agree to the above.

## 2018-10-24 ENCOUNTER — Other Ambulatory Visit (HOSPITAL_COMMUNITY): Payer: Self-pay | Admitting: Physician Assistant

## 2018-10-25 ENCOUNTER — Encounter: Payer: Self-pay | Admitting: Cardiology

## 2018-11-15 ENCOUNTER — Ambulatory Visit (HOSPITAL_COMMUNITY)
Admission: RE | Admit: 2018-11-15 | Discharge: 2018-11-15 | Disposition: A | Discharge status: Home / Self Care | Payer: BLUE CROSS/BLUE SHIELD | Source: Ambulatory Visit | Attending: Physician Assistant | Admitting: Physician Assistant

## 2018-11-15 ENCOUNTER — Other Ambulatory Visit: Payer: Self-pay

## 2018-11-15 ENCOUNTER — Other Ambulatory Visit (HOSPITAL_COMMUNITY): Payer: Self-pay | Admitting: *Deleted

## 2018-11-15 ENCOUNTER — Encounter (HOSPITAL_COMMUNITY): Payer: Self-pay | Admitting: Physician Assistant

## 2018-11-15 VITALS — Ht 64.0 in

## 2018-11-15 DIAGNOSIS — I480 Paroxysmal atrial fibrillation: Secondary | ICD-10-CM

## 2018-11-15 MED ORDER — DILTIAZEM HCL ER COATED BEADS 120 MG PO CP24: 120.0000 mg | ORAL_CAPSULE | Freq: Every day | ORAL | 6 refills | Status: DC

## 2018-11-15 NOTE — Progress Notes (Signed)
Electrophysiology TeleHealth Note   Due to national recommendations of social distancing due to COVID 19, Audio/video telehealth visit is felt to be most appropriate for this patient at this time.  See consent below from today for patient consent regarding telehealth for the [facility]. Consent obtained verbally.   Date:  11/15/2018   ID:  [name], DOB 1950/08/05, MRN [id number]  Location: home  Provider location: [address] [place], [place] [address] Evaluation Performed: Follow up  PCP:  [name], MD   CC: Follow up for atrial fibrillation   History of Present Illness: Jennifer Ward is a 68 y.o. female who presents via audio/video conferencing for a telehealth visit today. Patient reports that since her last visit, her fatigue symptoms have persistent. Her cardiac monitor results showed no afib but she did have runs of atrial tachycardia. She noted that her heart racing occurred more frequently when she drank caffeine. She eliminated caffeine from her diet recently and she states that her palpations have greatly improved, especially at night.  Today, she denies symptoms of chest pain, shortness of breath, orthopnea, PND, lower extremity edema, claudication, dizziness, presyncope, syncope, bleeding, or neurologic sequela. The patient is tolerating medications without difficulties and is otherwise without complaint today.   she denies symptoms of cough, fevers, chills, or new SOB worrisome for COVID 19. +fatigue.   Atrial Fibrillation Risk Factors:  she does not have symptoms or diagnosis of sleep apnea. she does not have a history of rheumatic fever. she does not have a history of alcohol use. The patient does not have a history of early familial atrial fibrillation or other arrhythmias.  she has a BMI of Body mass index is 35.36 kg/m..  BP 114/81 Pulse 58 Provided by patient with home BP machine   Past Medical History:  Diagnosis Date  . Anemia    no current med.  . Arthritis    toes  . Complication of anesthesia    states is hard to wake up post-op  . Enlarged heart    no cardiologist, per pt.  . Hallux rigidus of left foot 09/2014  . Hypertension    states under control with med., has been on med. x 8 yr.  . Non-insulin dependent type 2 diabetes mellitus (HCC)   . Seasonal allergies   . Sinus headache    Past Surgical History:  Procedure Laterality Date  . ABDOMINAL HYSTERECTOMY     partial  . CERVIX LESION DESTRUCTION    . CHEILECTOMY  06/22/2011   Procedure: CHEILECTOMY;  Surgeon: Loreta Ave, MD;  Location: [facility];  Service: Orthopedics;  Laterality: Right;  right great toe hallux rigidus with cheilectomy 1st metatarsophalangeal  . CHEILECTOMY Left 09/24/2014   Procedure: LEFT FOOT HALLUX RIGIDUS WITH CHEILECTOMY GREAT TOE;  Surgeon: Mckinley Jewel, MD;  Location:  SURGERY CENTER;  Service: Orthopedics;  Laterality: Left;  . COLONOSCOPY    . KNEE ARTHROSCOPY Left   . NASAL SEPTUM SURGERY       Current Outpatient Medications  Medication Sig Dispense Refill  . allopurinol (ZYLOPRIM) 300 MG tablet Take 300 mg by mouth daily.  3  . apixaban (ELIQUIS) 5 MG TABS tablet Take 1 tablet (5 mg total) by mouth 2 (two) times daily. 60 tablet 3  . estradiol (VIVELLE-DOT) 0.025 MG/24HR Place 1 patch onto the skin 2 (two) times a week. On Tuesday and Friday    . loratadine (CLARITIN) 10 MG tablet Take  10 mg by mouth daily.  2  . losartan (COZAAR) 25 MG tablet Take 25 mg by mouth daily.   2  . metFORMIN (GLUCOPHAGE) 500 MG tablet Take 500 mg by mouth daily.    . metoprolol succinate (TOPROL-XL) 25 MG 24 hr tablet TAKE 0.5 TABLETS (12.5 MG TOTAL) BY MOUTH AT BEDTIME. 45 tablet 1  . sitaGLIPtin (JANUVIA) 50 MG tablet Take 50 mg by mouth daily.     No current facility-administered medications for this encounter.     Allergies:   Latex   Social History:  The patient  reports that she has never smoked.  She has never used smokeless tobacco. She reports that she does not drink alcohol or use drugs.   Family History:  The patient's  family history is not on file.    ROS:  Please see the history of present illness.   All other systems are personally reviewed and negative.   Recent Labs: 09/11/2018: ALT 23; BUN 24; Creatinine, Ser 1.12; Hemoglobin 13.9; Platelets 307; Potassium 3.5; Sodium 137  personally reviewed   Exam: Well appearing, alert and conversant, regular work of breathing,  good skin color  Other studies personally reviewed: Additional studies/ records that were reviewed today include: Epic notes   Cardiac monitor 11/05/18 Predominant rhythm is sinus rhythm Occasional premature atrial contractions and premature ventricular contractions are noted Disorganized atrial activity is rare and episodes are short SVT episodes are observed which are long RP in nature and likely represent atrial tachycardia A single 5 beats nonsustained VT episode is also observed Rate nocturnal bradycardia is observed   ASSESSMENT AND PLAN:  1. Paroxysmal atrial fibrillation/atrial tachycardia Recent monitor showed atrial tachycardia but no afib.  Will changes Toprol to diltiazem 120 mg daily to help with fatigue symptoms and rate control.  Encouraged caffeine avoidance as this seems to be a trigger for her. Continue Eliquis 5 mg daily Recent CBC/Bmet/TSH unremarkable.  Echocardiogram scheduled.  This patients CHA2DS2-VASc Score and unadjusted Ischemic Stroke Rate (% per year) is equal to 4.8 % stroke rate/year from a score of 4  Above score calculated as 1 point each if present [CHF, HTN, DM, Vascular=MI/PAD/Aortic Plaque, Age if 65-74, or Female] Above score calculated as 2 points each if present [Age > 75, or Stroke/TIA/TE]  2. Obesity Lifestyle modification was discussed and encouraged including regular physical activity and weight reduction.  3. HTN Stable, med changes as above.    COVID screen The patient does not have any symptoms that suggest any further testing/ screening at this time.  Social distancing reinforced today.    Follow-up with AF clinic in 2 months.   Current medicines are reviewed at length with the patient today.   The patient does not have concerns regarding her medicines.  The following changes were made today: Stop Toprol, start diltiazem   Labs/ tests ordered today include: none   Patient Risk:  after full review of this patients clinical status, I feel that they are at moderate risk at this time.   Today, I have spent 20 minutes with the patient with telehealth technology discussing Zio patch results, medication changes, and COVID-19 precautions.    Dalia Heading PA-C 11/15/2018 9:31 AM  [facility] [address] [place], [place] [address] [phone]   I hereby voluntarily request, consent and authorize the [facility] Clinic and its employed or contracted physicians, physician assistants, nurse practitioners or other licensed health care professionals (the Practitioner), to provide me  with telemedicine health care services (the "Services") as deemed necessary by the treating Practitioner. I acknowledge and consent to receive the Services by the Practitioner via telemedicine. I understand that the telemedicine visit will involve communicating with the Practitioner through live audiovisual communication technology and the disclosure of certain medical information by electronic transmission. I acknowledge that I have been given the opportunity to request an in-person assessment or other available alternative prior to the telemedicine visit and am voluntarily participating in the telemedicine visit.   I understand that I have the right to withhold or withdraw my consent to the use of telemedicine in the course of my care at any time, without affecting my right to future care or treatment, and that the  Practitioner or I may terminate the telemedicine visit at any time. I understand that I have the right to inspect all information obtained and/or recorded in the course of the telemedicine visit and may receive copies of available information for a reasonable fee.  I understand that some of the potential risks of receiving the Services via telemedicine include:   Delay or interruption in medical evaluation due to technological equipment failure or disruption;  Information transmitted may not be sufficient (e.g. poor resolution of images) to allow for appropriate medical decision making by the Practitioner; and/or  In rare instances, security protocols could fail, causing a breach of personal health information.   Furthermore, I acknowledge that it is my responsibility to provide information about my medical history, conditions and care that is complete and accurate to the best of my ability. I acknowledge that Practitioner's advice, recommendations, and/or decision may be based on factors not within their control, such as incomplete or inaccurate data provided by me or distortions of diagnostic images or specimens that may result from electronic transmissions. I understand that the practice of medicine is not an exact science and that Practitioner makes no warranties or guarantees regarding treatment outcomes. I acknowledge that I will receive a copy of this consent concurrently upon execution via email to the email address I last provided but may also request a printed copy by calling the office of the [facility] Clinic.  I understand that my insurance will be billed for this visit.   I have read or had this consent read to me.  I understand the contents of this consent, which adequately explains the benefits and risks of the Services being provided via telemedicine.  I have been provided ample opportunity to ask questions regarding this consent and the Services and have had my questions  answered to my satisfaction.  I give my informed consent for the services to be provided through the use of telemedicine in my medical care  By participating in this telemedicine visit I agree to the above.

## 2018-11-21 ENCOUNTER — Ambulatory Visit (HOSPITAL_COMMUNITY)
Admission: RE | Admit: 2018-11-21 | Discharge: 2018-11-21 | Disposition: A | Discharge status: Home / Self Care | Payer: BLUE CROSS/BLUE SHIELD | Source: Ambulatory Visit | Attending: Physician Assistant | Admitting: Physician Assistant

## 2018-11-21 ENCOUNTER — Other Ambulatory Visit: Payer: Self-pay

## 2018-11-21 DIAGNOSIS — E119 Type 2 diabetes mellitus without complications: Secondary | ICD-10-CM | POA: Diagnosis not present

## 2018-11-21 DIAGNOSIS — I10 Essential (primary) hypertension: Secondary | ICD-10-CM | POA: Diagnosis not present

## 2018-11-21 DIAGNOSIS — I480 Paroxysmal atrial fibrillation: Secondary | ICD-10-CM | POA: Insufficient documentation

## 2018-11-21 DIAGNOSIS — I071 Rheumatic tricuspid insufficiency: Secondary | ICD-10-CM | POA: Insufficient documentation

## 2018-11-21 NOTE — Progress Notes (Signed)
  Echocardiogram 2D Echocardiogram has been performed.  Celene Skeen 11/21/2018, 12:28 PM

## 2018-11-22 ENCOUNTER — Other Ambulatory Visit (HOSPITAL_COMMUNITY): Payer: Self-pay | Admitting: *Deleted

## 2018-11-22 MED ORDER — METOPROLOL SUCCINATE ER 25 MG PO TB24: 12.5000 mg | ORAL_TABLET | Freq: Every day | ORAL | 3 refills | Status: DC

## 2018-11-27 ENCOUNTER — Telehealth: Payer: Self-pay | Admitting: *Deleted

## 2018-11-27 NOTE — Telephone Encounter (Signed)
Calling patient today to discuss upcoming appointment.  We are currently trying to limit exposure to the virus that causes COVID-19 by seeing patients at home rather than in the office. We would like to schedule this appointment as a Virtual Appointment VIA Smartphone or Laptop. Unable to reach patient.  LVMTCB  

## 2018-11-28 ENCOUNTER — Other Ambulatory Visit: Payer: Self-pay

## 2018-11-28 ENCOUNTER — Telehealth (INDEPENDENT_AMBULATORY_CARE_PROVIDER_SITE_OTHER): Payer: BLUE CROSS/BLUE SHIELD | Admitting: Cardiology

## 2018-11-28 VITALS — BP 117/82 | HR 66

## 2018-11-28 DIAGNOSIS — I471 Supraventricular tachycardia, unspecified: Secondary | ICD-10-CM

## 2018-11-28 DIAGNOSIS — I5020 Unspecified systolic (congestive) heart failure: Secondary | ICD-10-CM

## 2018-11-28 DIAGNOSIS — R079 Chest pain, unspecified: Secondary | ICD-10-CM

## 2018-11-28 NOTE — Addendum Note (Signed)
Addended by: Baird Lyons on: 11/28/2018 05:42 PM   Modules accepted: Orders

## 2018-11-28 NOTE — Addendum Note (Signed)
Addended by: Baird Lyons on: 11/28/2018 05:26 PM   Modules accepted: Orders

## 2018-11-28 NOTE — Progress Notes (Signed)
Virtual Visit via Video Note   This visit type was conducted due to national recommendations for restrictions regarding the COVID-19 Pandemic (e.g. social distancing) in an effort to limit this patient's exposure and mitigate transmission in our community.  Due to her co-morbid illnesses, this patient is at least at moderate risk for complications without adequate follow up.  This format is felt to be most appropriate for this patient at this time.  All issues noted in this document were discussed and addressed.  A limited physical exam was performed with this format.  Please refer to the patient's chart for her consent to telehealth for [facility].   Date:  11/28/2018   ID:  Jennifer Ward, DOB [birth year], MRN [id number]  Patient Location: Home Provider Location: Home  PCP:  [name], MD  Cardiologist:  No primary care provider on file.  Electrophysiologist:  None   Evaluation Performed:  Consultation - Myleigh Mccauslin was referred by Jennifer Ward for the evaluation of AF, AT.  Chief Complaint:  palpitations  History of Present Illness:    Jennifer Ward is a 68 y.o. female with a history significant for diabetes, hypertension, and paroxysmal atrial fibrillation.  He was diagnosed with atrial fibrillation at her PCPs office after being seen left shoulder pain.  She was evaluated in the emergency room and started on metoprolol and Eliquis.  She was unaware of her arrhythmia.  She had an echocardiogram that showed an ejection fraction of 35 to 40%.  She currently feels weak and fatigued as well as cold.  She has attributed the cold feeling to her blood thinner.  She also has been weak and fatigued that she attributes to her metoprolol.  She was previously on diltiazem without weakness or fatigue but was switched back to metoprolol when her ejection fraction was found to be low.  She has not noted any further episodes of tachycardia.  She did wear a cardiac monitor that showed episodes of SVT with  a long RP interval.  The patient does not have symptoms concerning for COVID-19 infection (fever, chills, cough, or new shortness of breath).    Past Medical History:  Diagnosis Date  . Anemia    no current med.  . Arthritis    toes  . Complication of anesthesia    states is hard to wake up post-op  . Enlarged heart    no cardiologist, per pt.  . Hallux rigidus of left foot 09/2014  . Hypertension    states under control with med., has been on med. x 8 yr.  . Non-insulin dependent type 2 diabetes mellitus (HCC)   . Seasonal allergies   . Sinus headache    Past Surgical History:  Procedure Laterality Date  . ABDOMINAL HYSTERECTOMY     partial  . CERVIX LESION DESTRUCTION    . CHEILECTOMY  06/22/2011   Procedure: CHEILECTOMY;  Surgeon: Loreta Ave, MD;  Location: [facility];  Service: Orthopedics;  Laterality: Right;  right great toe hallux rigidus with cheilectomy 1st metatarsophalangeal  . CHEILECTOMY Left 09/24/2014   Procedure: LEFT FOOT HALLUX RIGIDUS WITH CHEILECTOMY GREAT TOE;  Surgeon: Mckinley Jewel, MD;  Location: [facility];  Service: Orthopedics;  Laterality: Left;  . COLONOSCOPY    . KNEE ARTHROSCOPY Left   . NASAL SEPTUM SURGERY       Current Meds  Medication Sig  . allopurinol (ZYLOPRIM) 300 MG tablet Take 300 mg by mouth daily.  Marland Kitchen apixaban (ELIQUIS)  5 MG TABS tablet Take 1 tablet (5 mg total) by mouth 2 (two) times daily.  Marland Kitchen estradiol (VIVELLE-DOT) 0.025 MG/24HR Place 1 patch onto the skin 2 (two) times a week. On Tuesday and Friday  . loratadine (CLARITIN) 10 MG tablet Take 10 mg by mouth daily.  Marland Kitchen losartan (COZAAR) 25 MG tablet Take 25 mg by mouth daily.   . metFORMIN (GLUCOPHAGE) 500 MG tablet Take 500 mg by mouth daily.  . metoprolol succinate (TOPROL XL) 25 MG 24 hr tablet Take 0.5 tablets (12.5 mg total) by mouth at bedtime.  . sitaGLIPtin (JANUVIA) 50 MG tablet Take 50 mg by mouth daily.     Allergies:   Latex    Social History   Tobacco Use  . Smoking status: Never Smoker  . Smokeless tobacco: Never Used  Substance Use Topics  . Alcohol use: No  . Drug use: No     Family Hx: The patient's family history is not on file.  ROS:   Please see the history of present illness.     All other systems reviewed and are negative.   Prior CV studies:   The following studies were reviewed today:  TTE 11/2018  1. The left ventricle has moderately reduced systolic function, with an ejection fraction of 35-40%. The cavity size was normal. Left ventricular diastolic parameters were normal. Left ventricular diffuse hypokinesis.  2. Trivial pericardial effusion is present.  3. The mitral valve is grossly normal.  4. The tricuspid valve is grossly normal.  5. The aortic valve is tricuspid. No stenosis of the aortic valve.  6. Moderate global reduction in LV systolic function.  Monitor 11/10/18 - personally reviewed Predominant rhythm is sinus rhythm Occasional premature atrial contractions and premature ventricular contractions are noted Disorganized atrial activity is rare and episodes are short SVT episodes are observed which are long RP in nature and likely represent atrial tachycardia A single 5 beats nonsustained VT episode is also observed Rate nocturnal bradycardia is observed  Labs/Other Tests and Data Reviewed:    EKG:  An ECG dated 09/19/18 was personally reviewed today and demonstrated:  SR, PACs  Recent Labs: 09/11/2018: ALT 23; BUN 24; Creatinine, Ser 1.12; Hemoglobin 13.9; Platelets 307; Potassium 3.5; Sodium 137   Recent Lipid Panel No results found for: CHOL, TRIG, HDL, CHOLHDL, LDLCALC, LDLDIRECT  Wt Readings from Last 3 Encounters:  09/19/18 206 lb (93.4 kg)  09/11/18 200 lb (90.7 kg)  09/24/14 215 lb (97.5 kg)     Objective:    Vital Signs:  BP 117/82   Pulse 66    VITAL SIGNS:  reviewed GEN:  no acute distress EYES:  sclerae anicteric, EOMI - Extraocular Movements Intact  RESPIRATORY:  normal respiratory effort, symmetric expansion CARDIOVASCULAR:  no peripheral edema SKIN:  no rash, lesions or ulcers. MUSCULOSKELETAL:  no obvious deformities. NEURO:  alert and oriented x 3, no obvious focal deficit PSYCH:  normal affect  ASSESSMENT & PLAN:    1. Paroxysmal atrial fibrillation: currently on Eliquis and metoprolol. Unfortunately I do not have evidence of atrial fibrilation by ECG. All ECGs that I have seen show SVT without AF noted. She apparenly was in AF at her PCP's office, Donae Kueker work to get that ECG.  If she does not have atrial fibrillation she would be able to come off of her Eliquis.  She is also quite symptomatic on her metoprolol.  I asked her to continue to try to take this medication to prevent episodes of SVT.  This patients CHA2DS2-VASc Score and unadjusted Ischemic Stroke Rate (% per year) is equal to 4.8 % stroke rate/year from a score of 4  Above score calculated as 1 point each if present [CHF, HTN, DM, Vascular=MI/PAD/Aortic Plaque, Age if 65-74, or Female] Above score calculated as 2 points each if present [Age > 75, or Stroke/TIA/TE]   2. Systolic heart failure: unknown cause. She has had some chest pain. Hendrixx Severin get cardiac CTA to determine if she has obstructive CAD to explain HF.  She is currently on Toprol-XL and losartan.  Her blood pressure is borderline.  Everado Pillsbury not titrate from here. 3. Hypertension: Currently well controlled   COVID-19 Education: The signs and symptoms of COVID-19 were discussed with the patient and how to seek care for testing (follow up with PCP or arrange E-visit).  The importance of social distancing was discussed today.  Time:   Today, I have spent 20 minutes with the patient with telehealth technology discussing the above problems.     Medication Adjustments/Labs and Tests Ordered: Current medicines are reviewed at length with the patient today.  Concerns regarding medicines are outlined above.   Tests  Ordered: No orders of the defined types were placed in this encounter.   Medication Changes: No orders of the defined types were placed in this encounter.   Disposition:  Follow up in 3 month(s)  Signed, Koleen Celia Jennifer Loa, MD  11/28/2018 12:31 PM    [facility]

## 2018-11-28 NOTE — Telephone Encounter (Signed)
Pt agreeable to teleheatlh visit today w/ [facility].     Virtual Visit Pre-Appointment Phone Call  "(Name), I am calling you today to discuss your upcoming appointment. We are currently trying to limit exposure to the virus that causes COVID-19 by seeing patients at home rather than in the office."  1. "What is the BEST phone number to call the day of the visit?" - include this in appointment notes  2. "Do you have or have access to (through a family member/friend) a smartphone with video capability that we can use for your visit?" a. If yes - list this number in appt notes as "cell" (if different from BEST phone #) and list the appointment type as a VIDEO visit in appointment notes b. If no - list the appointment type as a PHONE visit in appointment notes  3. Confirm consent - "In the setting of the current Covid19 crisis, you are scheduled for a (phone or video) visit with your provider on (date) at (time).  Just as we do with many in-office visits, in order for you to participate in this visit, we must obtain consent.  If you'd like, I can send this to your mychart (if signed up) or email for you to review.  Otherwise, I can obtain your verbal consent now.  All virtual visits are billed to your insurance company just like a normal visit would be.  By agreeing to a virtual visit, we'd like you to understand that the technology does not allow for your provider to perform an examination, and thus may limit your provider's ability to fully assess your condition. If your provider identifies any concerns that need to be evaluated in person, we will make arrangements to do so.  Finally, though the technology is pretty good, we cannot assure that it will always work on either your or our end, and in the setting of a video visit, we may have to convert it to a phone-only visit.  In either situation, we cannot ensure that we have a secure connection.  Are you willing to proceed?" STAFF: Did the patient  verbally acknowledge consent to telehealth visit? Document YES/NO here: YES  4. Advise patient to be prepared - "Two hours prior to your appointment, go ahead and check your blood pressure, pulse, oxygen saturation, and your weight (if you have the equipment to check those) and write them all down. When your visit starts, your provider will ask you for this information. If you have an Apple Watch or Kardia device, please plan to have heart rate information ready on the day of your appointment. Please have a pen and paper handy nearby the day of the visit as well."  5. Give patient instructions for MyChart download to smartphone OR Doximity/Doxy.me as below if video visit (depending on what platform provider is using)  6. Inform patient they will receive a phone call 15 minutes prior to their appointment time (may be from unknown caller ID) so they should be prepared to answer    TELEPHONE CALL NOTE  Gizelle Phaneuf has been deemed a candidate for a follow-up tele-health visit to limit community exposure during the Covid-19 pandemic. I spoke with the patient via phone to ensure availability of phone/video source, confirm preferred email & phone number, and discuss instructions and expectations.  I reminded Jennifer Ward to be prepared with any vital sign and/or heart rhythm information that could potentially be obtained via home monitoring, at the time of her visit. I reminded Jennifer Ward  to expect a phone call prior to her visit.  Baird Lyons, RN 11/28/2018 8:59 AM   INSTRUCTIONS FOR DOWNLOADING THE MYCHART APP TO SMARTPHONE  - The patient must first make sure to have activated MyChart and know their login information - If Apple, go to [employer] and type in MyChart in the search bar and download the app. If Android, ask patient to go to [employer] and type in [place] in the search bar and download the app. The app is free but as with any other app downloads, their phone may require them to  verify saved payment information or Apple/Android password.  - The patient will need to then log into the app with their MyChart username and password, and select [facility] as their healthcare provider to link the account. When it is time for your visit, go to the MyChart app, find appointments, and click Begin Video Visit. Be sure to Select Allow for your device to access the Microphone and Camera for your visit. You will then be connected, and your provider will be with you shortly.  **If they have any issues connecting, or need assistance please contact MyChart service desk [phone])**  **If using a computer, in order to ensure the best quality for their visit they will need to use either of the following Internet Browsers: [employer] or Google Chrome**  IF USING DOXIMITY or DOXY.ME - The patient will receive a link just prior to their visit by text.     FULL LENGTH CONSENT FOR TELE-HEALTH VISIT   I hereby voluntarily request, consent and authorize [facility] and its employed or contracted physicians, physician assistants, nurse practitioners or other licensed health care professionals (the Practitioner), to provide me with telemedicine health care services (the "Services") as deemed necessary by the treating Practitioner. I acknowledge and consent to receive the Services by the Practitioner via telemedicine. I understand that the telemedicine visit will involve communicating with the Practitioner through live audiovisual communication technology and the disclosure of certain medical information by electronic transmission. I acknowledge that I have been given the opportunity to request an in-person assessment or other available alternative prior to the telemedicine visit and am voluntarily participating in the telemedicine visit.  I understand that I have the right to withhold or withdraw my consent to the use of telemedicine in the course of my care at any time,  without affecting my right to future care or treatment, and that the Practitioner or I may terminate the telemedicine visit at any time. I understand that I have the right to inspect all information obtained and/or recorded in the course of the telemedicine visit and may receive copies of available information for a reasonable fee.  I understand that some of the potential risks of receiving the Services via telemedicine include:  Marland Kitchen Delay or interruption in medical evaluation due to technological equipment failure or disruption; . Information transmitted may not be sufficient (e.g. poor resolution of images) to allow for appropriate medical decision making by the Practitioner; and/or  . In rare instances, security protocols could fail, causing a breach of personal health information.  Furthermore, I acknowledge that it is my responsibility to provide information about my medical history, conditions and care that is complete and accurate to the best of my ability. I acknowledge that Practitioner's advice, recommendations, and/or decision may be based on factors not within their control, such as incomplete or inaccurate data provided by me or distortions of diagnostic images or  specimens that may result from electronic transmissions. I understand that the practice of medicine is not an exact science and that Practitioner makes no warranties or guarantees regarding treatment outcomes. I acknowledge that I will receive a copy of this consent concurrently upon execution via email to the email address I last provided but may also request a printed copy by calling the office of [facility].    I understand that my insurance will be billed for this visit.   I have read or had this consent read to me. . I understand the contents of this consent, which adequately explains the benefits and risks of the Services being provided via telemedicine.  . I have been provided ample opportunity to ask questions regarding  this consent and the Services and have had my questions answered to my satisfaction. . I give my informed consent for the services to be provided through the use of telemedicine in my medical care  By participating in this telemedicine visit I agree to the above.

## 2018-12-11 ENCOUNTER — Telehealth (HOSPITAL_COMMUNITY): Payer: Self-pay | Admitting: Emergency Medicine

## 2018-12-11 ENCOUNTER — Other Ambulatory Visit (HOSPITAL_COMMUNITY): Payer: Self-pay | Admitting: Emergency Medicine

## 2018-12-11 DIAGNOSIS — N289 Disorder of kidney and ureter, unspecified: Secondary | ICD-10-CM

## 2018-12-11 NOTE — Telephone Encounter (Signed)
Reaching out to patient to offer assistance regarding upcoming cardiac imaging study; pt verbalizes understanding of appt date/time, parking situation and where to check in, pre-test NPO status and medications ordered, and verified current allergies; name and call back number provided for further questions should they arise Tyrann Donaho RN Navigator Cardiac Imaging [facility] [phone] office [phone] cell  Pt denies covid symptoms, verbalized understanding of visitor policy. 

## 2018-12-13 ENCOUNTER — Ambulatory Visit (HOSPITAL_COMMUNITY)
Admission: RE | Admit: 2018-12-13 | Discharge: 2018-12-13 | Disposition: A | Discharge status: Home / Self Care | Payer: BLUE CROSS/BLUE SHIELD | Source: Ambulatory Visit | Attending: Cardiology | Admitting: Cardiology

## 2018-12-13 ENCOUNTER — Encounter (HOSPITAL_COMMUNITY): Payer: Self-pay

## 2018-12-13 ENCOUNTER — Ambulatory Visit (HOSPITAL_COMMUNITY): Payer: BLUE CROSS/BLUE SHIELD

## 2018-12-13 ENCOUNTER — Other Ambulatory Visit: Payer: Self-pay

## 2018-12-13 DIAGNOSIS — R079 Chest pain, unspecified: Secondary | ICD-10-CM

## 2018-12-13 DIAGNOSIS — I5020 Unspecified systolic (congestive) heart failure: Secondary | ICD-10-CM

## 2018-12-13 DIAGNOSIS — R0789 Other chest pain: Secondary | ICD-10-CM | POA: Insufficient documentation

## 2018-12-13 LAB — POCT I-STAT CREATININE: Creatinine, Ser: 1 mg/dL (ref 0.44–1.00)

## 2018-12-13 MED ORDER — METOPROLOL TARTRATE 5 MG/5ML IV SOLN
5.0000 mg | INTRAVENOUS | Status: DC | PRN
  Filled 2018-12-13: qty 5

## 2018-12-13 MED ORDER — NITROGLYCERIN 0.4 MG SL SUBL
SUBLINGUAL_TABLET | SUBLINGUAL | Status: AC
  Filled 2018-12-13: qty 2

## 2018-12-13 MED ORDER — IOHEXOL 350 MG/ML SOLN
80.0000 mL | Freq: Once | INTRAVENOUS | Status: AC | PRN
  Administered 2018-12-13: 80 mL via INTRAVENOUS

## 2018-12-13 MED ORDER — NITROGLYCERIN 0.4 MG SL SUBL
0.8000 mg | SUBLINGUAL_TABLET | SUBLINGUAL | Status: DC | PRN
  Administered 2018-12-13: 0.8 mg via SUBLINGUAL
  Filled 2018-12-13 (×2): qty 25

## 2018-12-18 ENCOUNTER — Other Ambulatory Visit: Payer: Self-pay | Admitting: *Deleted

## 2018-12-18 DIAGNOSIS — Z20822 Contact with and (suspected) exposure to covid-19: Secondary | ICD-10-CM

## 2018-12-20 LAB — NOVEL CORONAVIRUS, NAA: SARS-CoV-2, NAA: NOT DETECTED

## 2018-12-26 ENCOUNTER — Telehealth: Payer: Self-pay | Admitting: *Deleted

## 2018-12-26 NOTE — Telephone Encounter (Signed)
lmtcb

## 2018-12-26 NOTE — Telephone Encounter (Signed)
-----   Message from Will Meredith Leeds, MD sent at 12/23/2018  3:09 PM EDT ----- Continue metoprolol, stop anticoagulation. ----- Message ----- From: Jeanann Lewandowsky, RMA Sent: 12/23/2018  11:38 AM EDT To: Constance Haw, MD  Spoke with Blanch Media at [facility] office, Dr. Kevan Ny.  She advised that they don't have any EKG's on file for this pt.

## 2018-12-26 NOTE — Telephone Encounter (Signed)
Advised pt to stop Eliquis and continue BB.  Pt reports she is extremely fatigued and can barely do anything.  She cannot get out of bed before 9am d/t fatigue.  She is returning to work soon and is not capable at this time d/t fatigue or worries that she will fall asleep at work. Will forward to Dr. Curt Bears for advisement. Pt was taking Diltiazem 120 mg for a week, then changed back to BB. Dr. Curt Bears - switch back to Diltiazem 120/higher dose?

## 2019-01-03 NOTE — Telephone Encounter (Signed)
Late Entry: Spoke to pt and advised ok to stop BB.  Offered to switch back to diltiazem but pt experienced swelling on that and does not wish to take . Pt is monitoring her HRs frequently during the day and with exercise.  She would like to keep monitoring until f/u end of August. Will make Dr. Curt Bears aware of pt wishes and call her back with any advisement, otherwise keep appt in August. Patient verbalized understanding and agreeable to plan.

## 2019-03-07 ENCOUNTER — Encounter: Payer: Self-pay | Admitting: Cardiology

## 2019-03-07 ENCOUNTER — Other Ambulatory Visit: Payer: Self-pay

## 2019-03-07 ENCOUNTER — Ambulatory Visit: Payer: BC Managed Care – PPO | Admitting: Cardiology

## 2019-03-07 VITALS — BP 108/72 | HR 71 | Ht 64.0 in | Wt 194.0 lb

## 2019-03-07 DIAGNOSIS — I480 Paroxysmal atrial fibrillation: Secondary | ICD-10-CM | POA: Diagnosis not present

## 2019-03-07 DIAGNOSIS — I428 Other cardiomyopathies: Secondary | ICD-10-CM | POA: Diagnosis not present

## 2019-03-07 NOTE — Progress Notes (Signed)
Electrophysiology Office Note   Date:  03/07/2019   ID:  Jennifer Ward, DOB March 18, 1951, MRN [id number]  PCP:  [name], MD  Cardiologist:   Primary Electrophysiologist:  Amel Gianino [name], MD    No chief complaint on file.    History of Present Illness: Shanai Vessell is a 68 y.o. female who is being seen today for the evaluation of SVT at the request of [name], MD. Presenting today for electrophysiology evaluation.  He has a history of diabetes, hypertension, paroxysmal atrial fibrillation.  She was diagnosed with atrial fibrillation after being seen by her primary physician for evaluation of shoulder pain.  She was started on Eliquis and metoprolol.  Echo showed an ejection fraction of 35 to 40%.  She wore a cardiac monitor that showed episodes of SVT with a long RP interval.  Today, she denies symptoms of palpitations, chest pain, shortness of breath, orthopnea, PND, lower extremity edema, claudication, dizziness, presyncope, syncope, bleeding, or neurologic sequela. The patient is tolerating medications without difficulties.  She is continued to exercise without issue.  Her heart rates get into the 160s when she is doing Zumba.  Otherwise she is doing well without complaint.   Past Medical History:  Diagnosis Date  . Anemia    no current med.  . Arthritis    toes  . Complication of anesthesia    states is hard to wake up post-op  . Enlarged heart    no cardiologist, per pt.  . Hallux rigidus of left foot 09/2014  . Hypertension    states under control with med., has been on med. x 8 yr.  . Non-insulin dependent type 2 diabetes mellitus (HCC)   . Seasonal allergies   . Sinus headache    Past Surgical History:  Procedure Laterality Date  . ABDOMINAL HYSTERECTOMY     partial  . CERVIX LESION DESTRUCTION    . CHEILECTOMY  06/22/2011   Procedure: CHEILECTOMY;  Surgeon: Loreta Ave, MD;  Location: [facility];  Service: Orthopedics;  Laterality:  Right;  right great toe hallux rigidus with cheilectomy 1st metatarsophalangeal  . CHEILECTOMY Left 09/24/2014   Procedure: LEFT FOOT HALLUX RIGIDUS WITH CHEILECTOMY GREAT TOE;  Surgeon: Mckinley Jewel, MD;  Location: [facility];  Service: Orthopedics;  Laterality: Left;  . COLONOSCOPY    . KNEE ARTHROSCOPY Left   . NASAL SEPTUM SURGERY       Current Outpatient Medications  Medication Sig Dispense Refill  . allopurinol (ZYLOPRIM) 300 MG tablet Take 300 mg by mouth daily.  3  . estradiol (VIVELLE-DOT) 0.025 MG/24HR Place 1 patch onto the skin 2 (two) times a week. On Tuesday and Friday    . loratadine (CLARITIN) 10 MG tablet Take 10 mg by mouth daily.  2  . losartan (COZAAR) 25 MG tablet Take 25 mg by mouth daily.   2  . metFORMIN (GLUCOPHAGE-XR) 500 MG 24 hr tablet Take 1 tablet by mouth daily.    . rosuvastatin (CRESTOR) 10 MG tablet Take 1 tablet by mouth 2 (two) times a week.    . sitaGLIPtin (JANUVIA) 50 MG tablet Take 50 mg by mouth daily.     No current facility-administered medications for this visit.     Allergies:   Latex   Social History:  The patient  reports that she has never smoked. She has never used smokeless tobacco. She reports that she does not drink alcohol or use drugs.   Family  History:  The patient's family history includes Cardiomyopathy in her brother and sister; Heart attack in her mother; Heart disease in her sister; Prostate cancer in her father; Stroke in her father.    ROS:  Please see the history of present illness.   Otherwise, review of systems is positive for none.   All other systems are reviewed and negative.    PHYSICAL EXAM: VS:  BP 108/72   Pulse 71   Ht 5\' 4"  (1.626 m)   Wt 194 lb (88 kg)   SpO2 97%   BMI 33.30 kg/m  , BMI Body mass index is 33.3 kg/m. GEN: Well nourished, well developed, in no acute distress  HEENT: normal  Neck: no JVD, carotid bruits, or masses Cardiac: RRR; no murmurs, rubs, or gallops,no edema   Respiratory:  clear to auscultation bilaterally, normal work of breathing GI: soft, nontender, nondistended, + BS MS: no deformity or atrophy  Skin: warm and dry Neuro:  Strength and sensation are intact Psych: euthymic mood, full affect  EKG:  EKG is ordered today. Personal review of the ekg ordered shows SR, PVC  Recent Labs: 09/11/2018: ALT 23; BUN 24; Hemoglobin 13.9; Platelets 307; Potassium 3.5; Sodium 137 12/13/2018: Creatinine, Ser 1.00    Lipid Panel  No results found for: CHOL, TRIG, HDL, CHOLHDL, VLDL, LDLCALC, LDLDIRECT   Wt Readings from Last 3 Encounters:  03/07/19 194 lb (88 kg)  09/19/18 206 lb (93.4 kg)  09/11/18 200 lb (90.7 kg)      Other studies Reviewed: Additional studies/ records that were reviewed today include: TTE 11/21/18  Review of the above records today demonstrates:   1. The left ventricle has moderately reduced systolic function, with an ejection fraction of 35-40%. The cavity size was normal. Left ventricular diastolic parameters were normal. Left ventricular diffuse hypokinesis.  2. Trivial pericardial effusion is present.  3. The mitral valve is grossly normal.  4. The tricuspid valve is grossly normal.  5. The aortic valve is tricuspid. No stenosis of the aortic valve.  6. Moderate global reduction in LV systolic function.  Cardiac monitor 11/10/18 Predominant rhythm is sinus rhythm Occasional premature atrial contractions and premature ventricular contractions are noted Disorganized atrial activity is rare and episodes are short SVT episodes are observed which are long RP in nature and likely represent atrial tachycardia A single 5 beats nonsustained VT episode is also observed Rate nocturnal bradycardia is observed  ASSESSMENT AND PLAN:  1.  SVT: Monitoring shows no evidence for atrial fibrillation.  She is currently not anticoagulated.  We Anne-Marie Genson thus continue with current management.  She has not noted any further episodes of tachycardia.    2.  Systolic heart failure: Coronary calcium score of 0 with normal coronary arteries and no evidence of coronary artery disease.  She did not tolerate metoprolol previously.  Darthy Manganelli continue losartan.  We Darwin Guastella repeat an echo in 6 months.  3.  Hypertension: Currently well controlled  Current medicines are reviewed at length with the patient today.   The patient does not have concerns regarding her medicines.  The following changes were made today:  none  Labs/ tests ordered today include:  Orders Placed This Encounter  Procedures  . EKG 12-Lead     Disposition:   FU with Amnah Breuer 6 months  Signed, Niyanna Asch Meredith Leeds, MD  03/07/2019 10:52 AM     [facility] [address] [place] [address] [phone] (office) [phone] (fax)

## 2019-03-07 NOTE — Patient Instructions (Signed)
Medication Instructions:  Your physician recommends that you continue on your current medications as directed. Please refer to the Current Medication list given to you today.  * If you need a refill on your cardiac medications before your next appointment, please call your pharmacy.   Labwork: None ordered  Testing/Procedures: Your physician has requested that you have an echocardiogram in 6 months, before your follow up with Dr. Curt Bears.. Echocardiography is a painless test that uses sound waves to create images of your heart. It provides your doctor with information about the size and shape of your heart and how well your heart's chambers and valves are working. This procedure takes approximately one hour. There are no restrictions for this procedure.  Follow-Up: Your physician wants you to follow-up in: 6 months with Dr. Curt Bears (after your echocardiogram has been completed).  You will receive a reminder letter in the mail two months in advance. If you don't receive a letter, please call our office to schedule the follow-up appointment.   Thank you for choosing [facility]!!   Trinidad Curet, RN [phone]  Any Other Special Instructions Will Be Listed Below (If Applicable).

## 2019-03-19 ENCOUNTER — Other Ambulatory Visit: Payer: Self-pay

## 2019-03-19 DIAGNOSIS — Z20822 Contact with and (suspected) exposure to covid-19: Secondary | ICD-10-CM

## 2019-03-20 LAB — NOVEL CORONAVIRUS, NAA: SARS-CoV-2, NAA: NOT DETECTED

## 2019-04-28 ENCOUNTER — Other Ambulatory Visit (HOSPITAL_COMMUNITY): Payer: Self-pay | Admitting: Physician Assistant

## 2019-07-10 ENCOUNTER — Other Ambulatory Visit: Payer: Self-pay

## 2019-07-10 DIAGNOSIS — Z20822 Contact with and (suspected) exposure to covid-19: Secondary | ICD-10-CM

## 2019-07-11 LAB — NOVEL CORONAVIRUS, NAA: SARS-CoV-2, NAA: NOT DETECTED

## 2019-08-15 ENCOUNTER — Other Ambulatory Visit: Payer: Self-pay

## 2019-08-15 ENCOUNTER — Ambulatory Visit (HOSPITAL_COMMUNITY): Payer: Medicare HMO | Attending: Cardiovascular Disease

## 2019-08-15 DIAGNOSIS — I480 Paroxysmal atrial fibrillation: Secondary | ICD-10-CM | POA: Diagnosis not present

## 2019-08-15 DIAGNOSIS — I428 Other cardiomyopathies: Secondary | ICD-10-CM | POA: Insufficient documentation

## 2019-10-10 ENCOUNTER — Ambulatory Visit: Payer: BC Managed Care – PPO | Admitting: Cardiology

## 2020-09-04 IMAGING — DX DG CHEST 2V
2 series · 2 of 2 positions shown · non-contrast
Comparison: None.

CLINICAL DATA: Atrial fibrillation

EXAM:
CHEST - 2 VIEW

[chest pa]
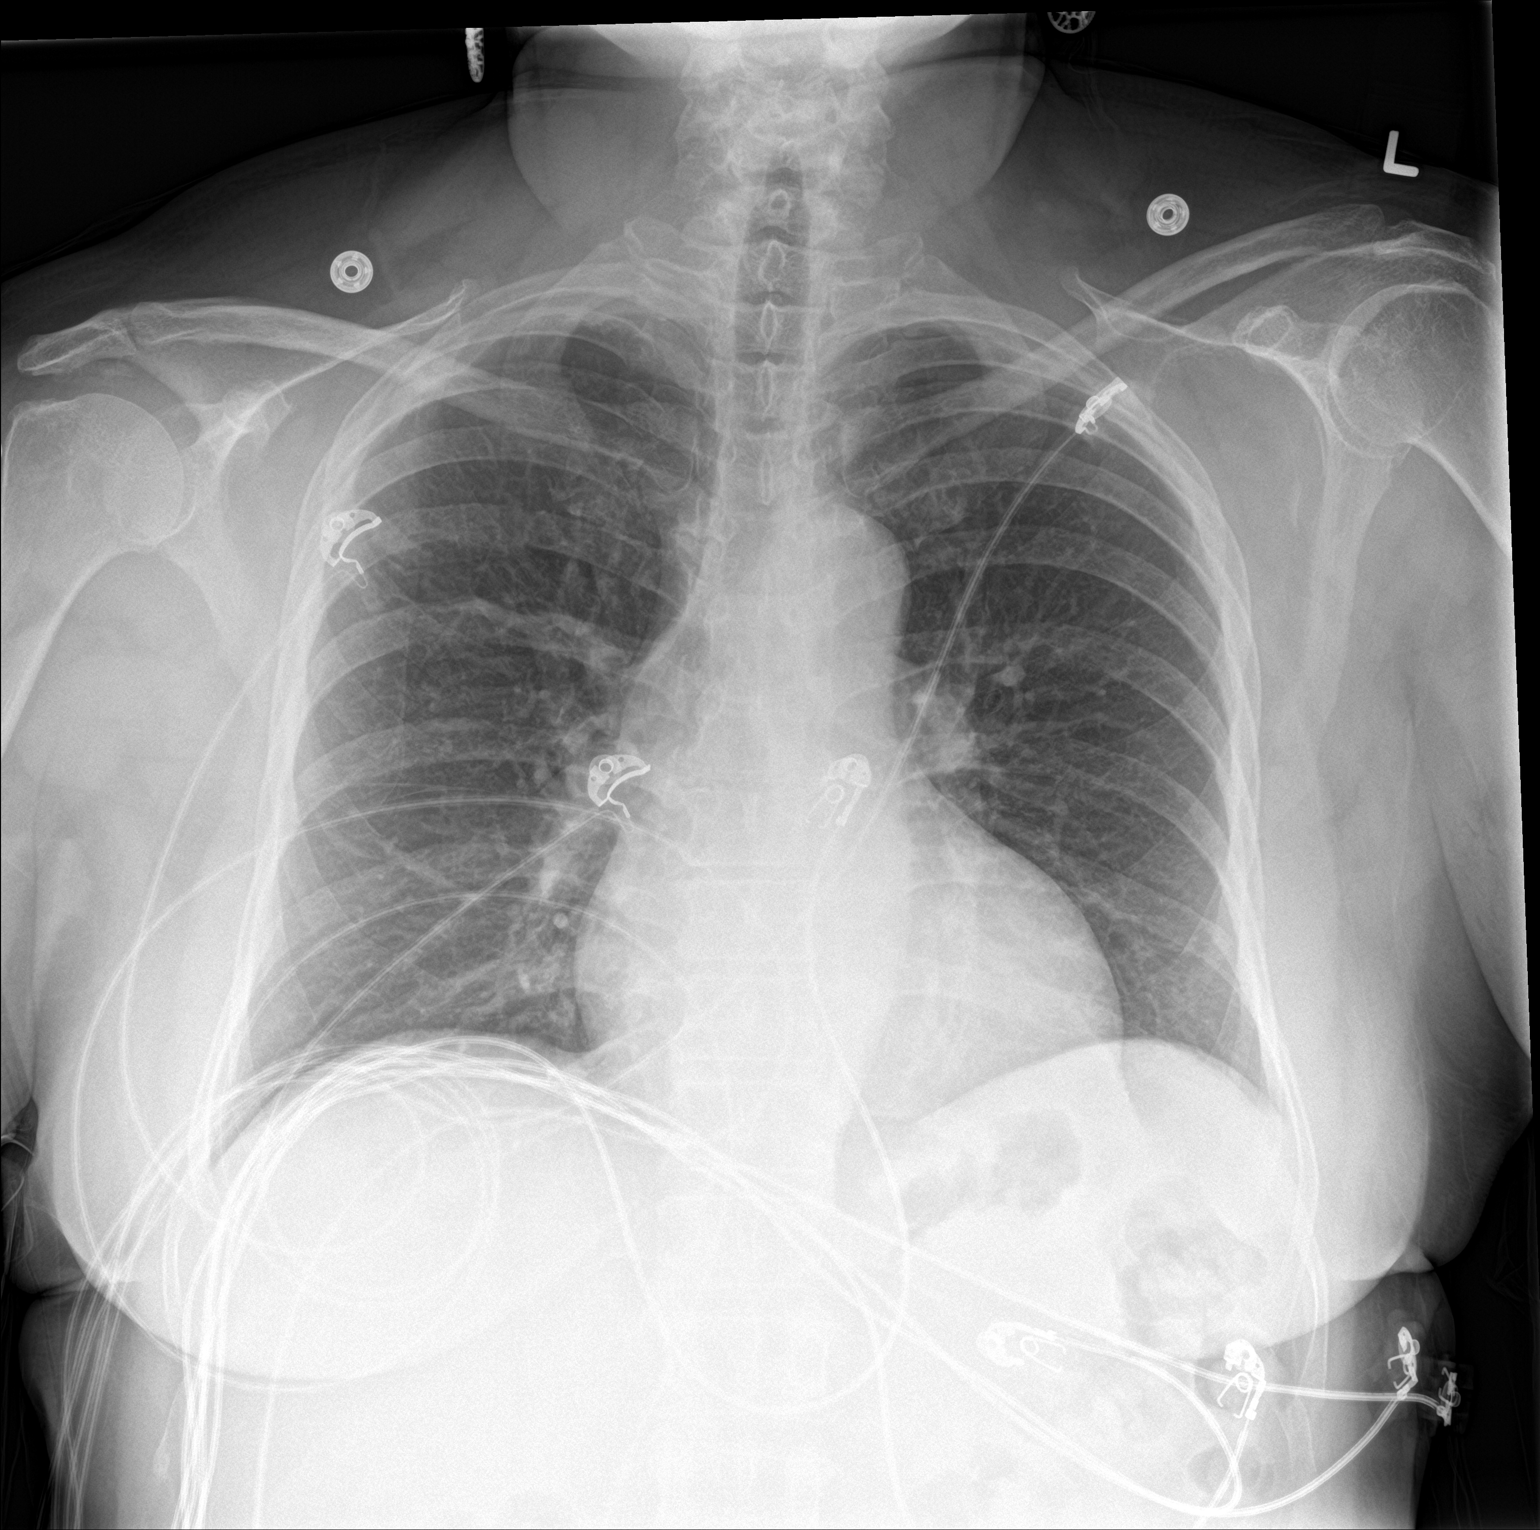

[chest lat]
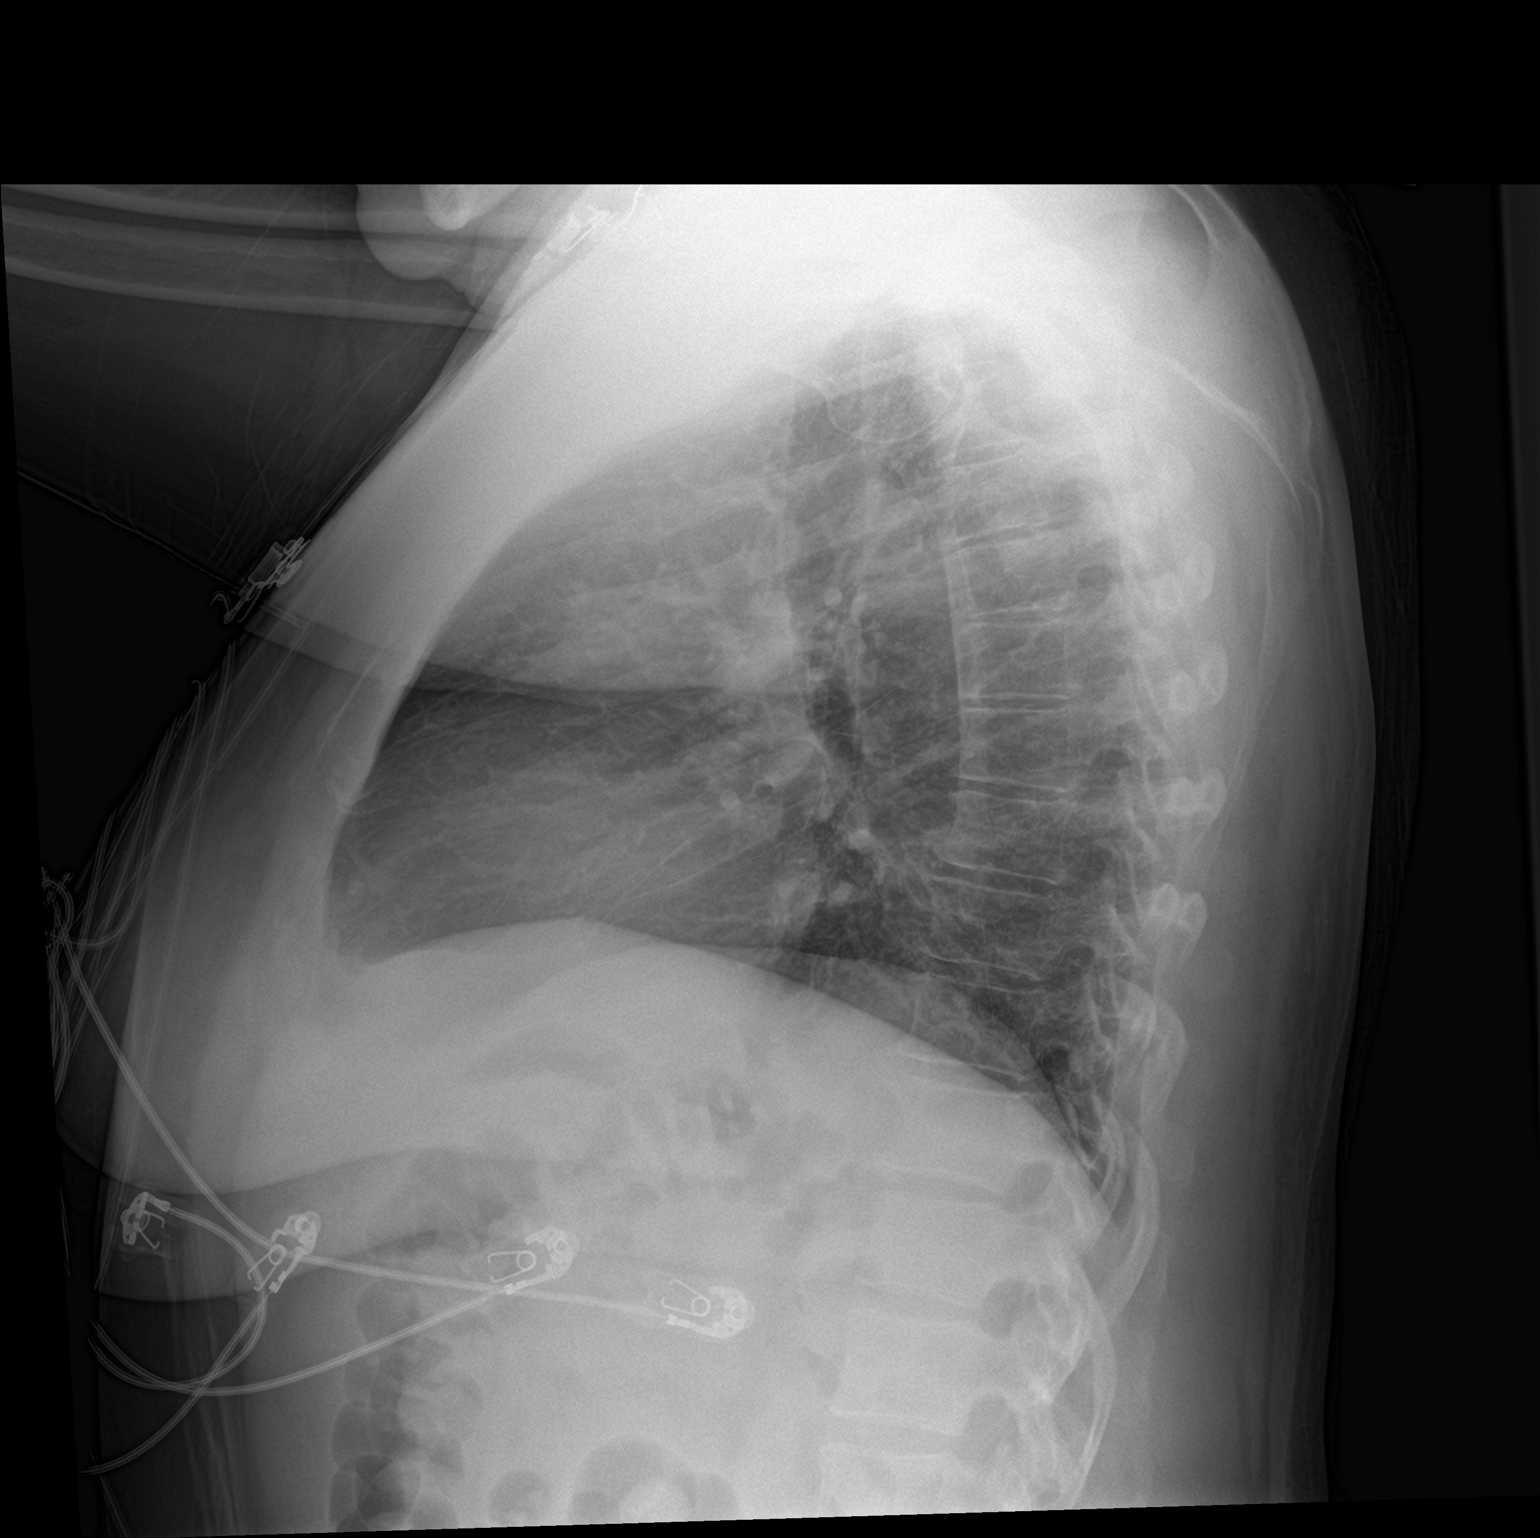

[2 of 2 positions shown; findings below may reference images not displayed]

FINDINGS: Linear tubular opacity in the right suprahilar lung presumably
represents a vascular structure. There is no acute consolidation or
effusion. The heart size is normal. There is no pneumothorax.
IMPRESSION: No active cardiopulmonary disease.

## 2020-12-06 IMAGING — CT CT HEAR MORPH WITH CTA COR WITH SCORE WITH CA WITH CONTRAST AND
4 of 7 series · 8 of 20 positions shown, 9 images · IV contrast (APPLIED)
Comparison: Heart
COMPARISON: Heart

Addendum:
EXAM:
OVER-READ INTERPRETATION  CT CHEST

The following report is an over-read performed by radiologist Dr.
Steini Okkar [REDACTED] on 12/13/2018. This over-read
does not include interpretation of cardiac or coronary anatomy or
pathology. The coronary CTA interpretation by the cardiologist is
attached.
CLINICAL DATA: 67-year-old female with DM, hypertension and
atypical chest pain.
Cardiac/Coronary  CT
TECHNIQUE: The patient was scanned on a [employer] Force scanner.

[Series 10: best diast 74 % · axial · 0.37mm/px · z∈[+1123,+1166]mm · 2 of 321 slices shown, 3 images]
[im 107/321  vessel]
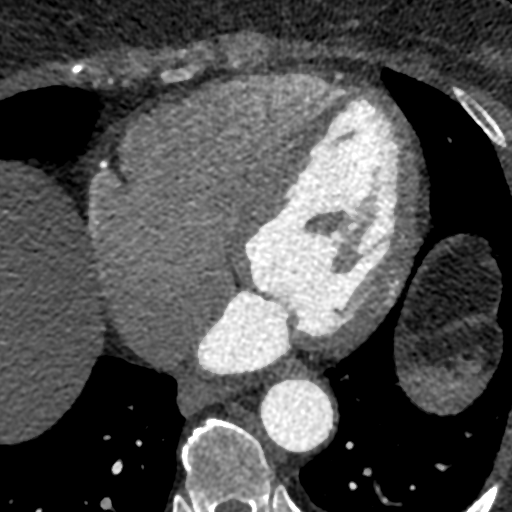
[im 107/321  lung]
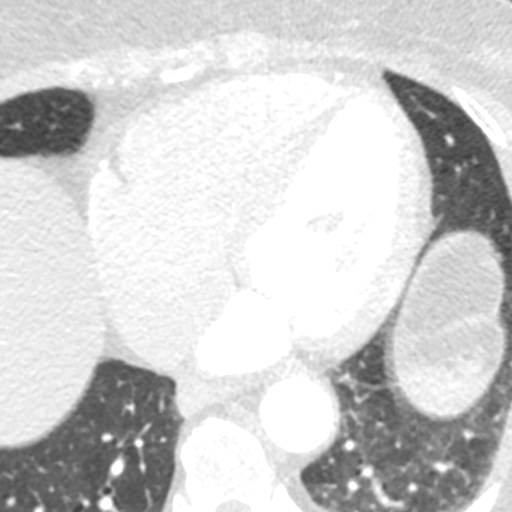
[im 214/321  vessel]
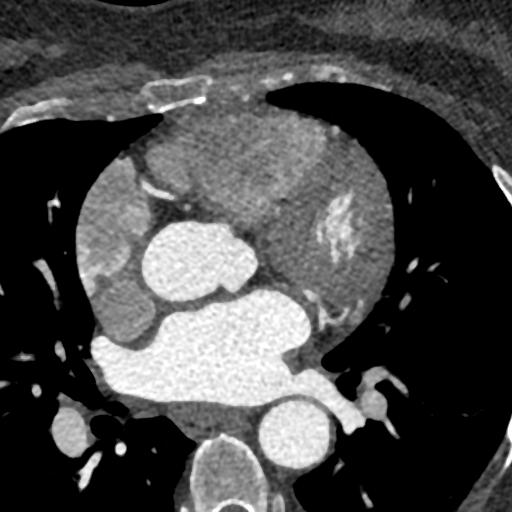

[Series 11: best syst 39 % · axial · 0.37mm/px · z∈[+1123,+1166]mm · 2 of 321 slices shown]
[im 107/321  vessel]
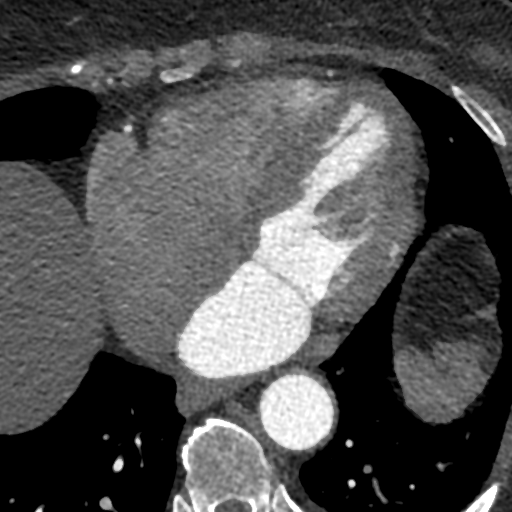
[im 214/321  vessel]
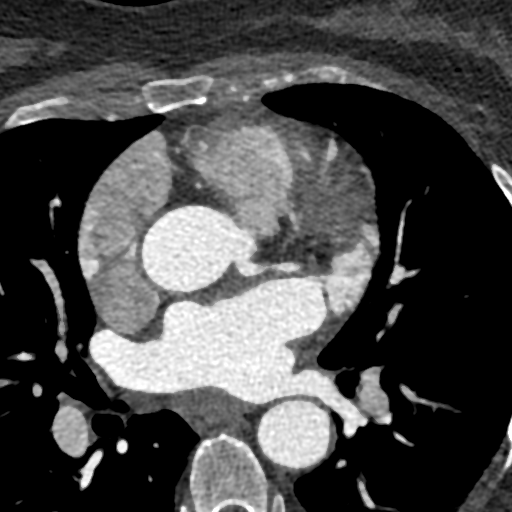

[Series 12: ts diast sharp 74 % · axial · 0.37mm/px · z∈[+1123,+1166]mm · 2 of 321 slices shown]
[im 107/321  lung]
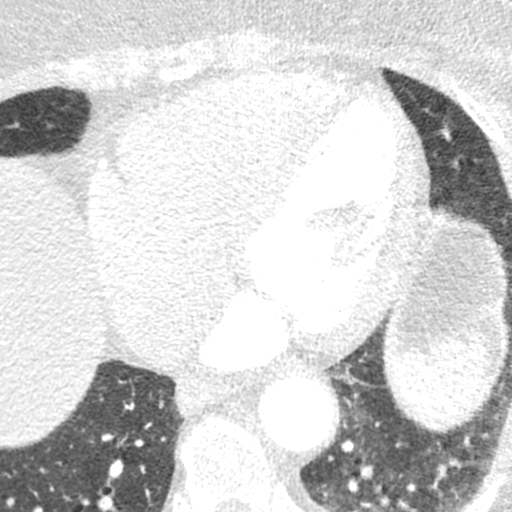
[im 214/321  lung]
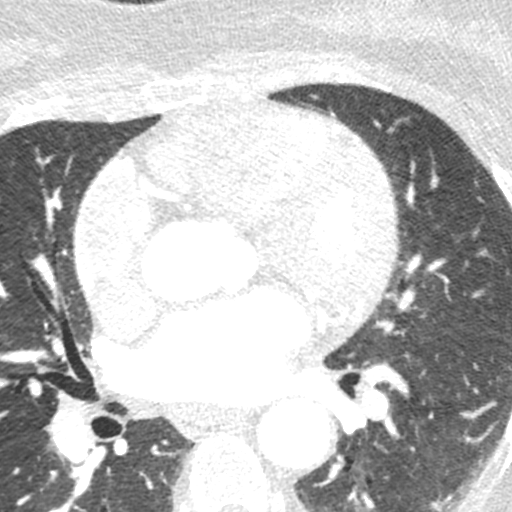

[Series 13: ts syst sharp 39 % · axial · 0.37mm/px · z∈[+1123,+1166]mm · 2 of 321 slices shown]
[im 107/321  lung]
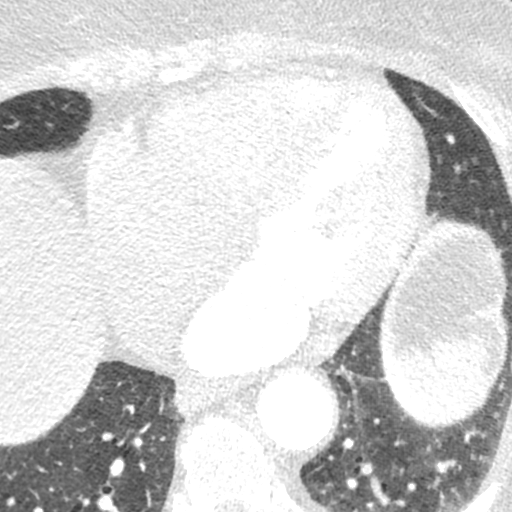
[im 214/321  lung]
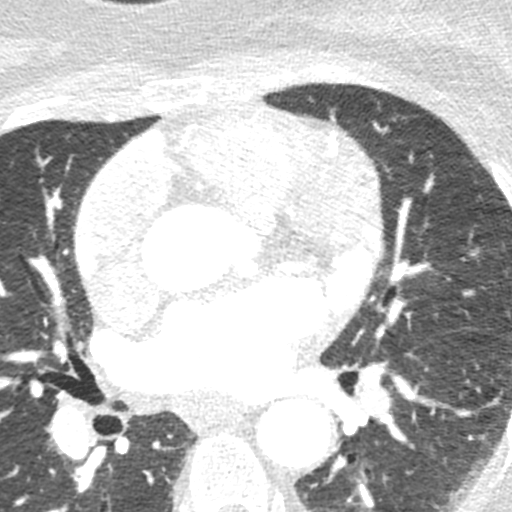

[8 of 20 positions shown; findings below may reference images not displayed]

FINDINGS: Vascular: None heart is normal size. Visualized aorta normal
caliber.

Mediastinum/Nodes: No adenopathy in the lower mediastinum or hila.

Lungs/Pleura: Visualized lungs clear.  No effusions.

Upper Abdomen: Imaging into the upper abdomen shows no acute
findings.

Musculoskeletal: Chest wall soft tissues are unremarkable. No acute
bony abnormality.
IMPRESSION: No acute or significant extracardiac abnormality.
FINDINGS: A 120 kV prospective scan was triggered in the descending thoracic
aorta at 111 HU's. Axial non-contrast 3 mm slices were carried out
through the heart. The data set was analyzed on a dedicated work
station and scored using the Agatson method. Gantry rotation speed
was 250 msecs and collimation was .6 mm. No beta blockade and 0.8 mg
of sl NTG was given. The 3D data set was reconstructed in 5%
intervals of the 67-82 % of the R-R cycle. Diastolic phases were
analyzed on a dedicated work station using MPR, MIP and VRT modes.
The patient received 80 cc of contrast.

Aorta:  Normal size.  No calcifications.  No dissection.

Aortic Valve: Trileaflet. Minimal diffuse atherosclerotic plaque and
calcifications.

Coronary Arteries:  Normal coronary origin.  Right dominance.

RCA is a large dominant artery that gives rise to PDA and PLVB.
There is no plaque.

Left main is a large artery that gives rise to LAD and LCX arteries.
Left main has no plaque.

LAD is a large vessel that gives rise to one diagonal artery and has
no plaque.

LCX is a non-dominant artery that gives rise to one large OM1
branch. There is no plaque.

Other findings:

Normal pulmonary vein drainage into the left atrium.

Normal let atrial appendage without a thrombus.

Normal size of the pulmonary artery.
IMPRESSION: 1. Coronary calcium score of 0. This was 0 percentile for age and
sex matched control.

2. Normal coronary origin with right dominance.

3. No evidence for CAD.  Consider non-cardiac causes of chest pain.

*** End of Addendum ***
EXAM:
OVER-READ INTERPRETATION  CT CHEST

The following report is an over-read performed by radiologist Dr.
Steini Okkar [REDACTED] on 12/13/2018. This over-read
does not include interpretation of cardiac or coronary anatomy or
pathology. The coronary CTA interpretation by the cardiologist is
attached.
FINDINGS: Vascular: None heart is normal size. Visualized aorta normal
caliber.

Mediastinum/Nodes: No adenopathy in the lower mediastinum or hila.

Lungs/Pleura: Visualized lungs clear.  No effusions.

Upper Abdomen: Imaging into the upper abdomen shows no acute
findings.

Musculoskeletal: Chest wall soft tissues are unremarkable. No acute
bony abnormality.
IMPRESSION: No acute or significant extracardiac abnormality.

## 2021-05-25 ENCOUNTER — Telehealth: Payer: Self-pay | Admitting: Cardiology

## 2021-05-25 NOTE — Telephone Encounter (Signed)
Patient states she is returning a call from our office. She states she is unsure what the call was regarding, but she assumed she did not need a follow up appointment. I informed her, per her last OV notes (03/07/19), Dr. Elberta Fortis advised that she follow up in 6 months after echo (08/15/19). She states during her last visit she was told that she was released and a follow up is not necessary, but states she is willing to schedule if Dr. Elberta Fortis would still like to see her. She is requesting clarification prior to scheduling overdue appointment. Please return call to discuss when able.

## 2023-05-30 ENCOUNTER — Encounter: Payer: Self-pay | Admitting: Podiatry

## 2023-05-30 ENCOUNTER — Ambulatory Visit: Payer: Medicare HMO | Admitting: Podiatry

## 2023-05-30 DIAGNOSIS — M722 Plantar fascial fibromatosis: Secondary | ICD-10-CM | POA: Diagnosis not present

## 2023-05-30 NOTE — Progress Notes (Signed)
Subjective:  Patient ID: Jennifer Ward, female    DOB: 1950/08/27,  MRN: [id number]  Chief Complaint  Patient presents with   Plantar Fasciitis    Rm#11 previous patient of Dr. Clearence Ped here for injection for plantar fasciitis symptoms back X 2 months patient states injections work for her. Right foot pain,burning.    72 y.o. female presents with the above complaint.  Patient presents with right heel pain that has been on for few months has progressive gotten worse.  She is known to Dr. Loreta Ave who received injections back in the day.  Has been on for 2 months hurts with ambulation worse with pressure she would like to discuss treatment options for this pain scale 7 out of 10 dull aching nature.   Review of Systems: Negative except as noted in the HPI. Denies N/V/F/Ch.  Past Medical History:  Diagnosis Date   Anemia    no current med.   Arthritis    toes   Complication of anesthesia    states is hard to wake up post-op   Enlarged heart    no cardiologist, per pt.   Hallux rigidus of left foot 09/2014   Hypertension    states under control with med., has been on med. x 8 yr.   Non-insulin dependent type 2 diabetes mellitus (HCC)    Seasonal allergies    Sinus headache     Current Outpatient Medications:    allopurinol (ZYLOPRIM) 300 MG tablet, Take 300 mg by mouth daily., Disp: , Rfl: 3   estradiol (VIVELLE-DOT) 0.025 MG/24HR, Place 1 patch onto the skin 2 (two) times a week. On Tuesday and Friday, Disp: , Rfl:    loratadine (CLARITIN) 10 MG tablet, Take 10 mg by mouth daily., Disp: , Rfl: 2   losartan (COZAAR) 25 MG tablet, Take 25 mg by mouth daily. , Disp: , Rfl: 2   metFORMIN (GLUCOPHAGE-XR) 500 MG 24 hr tablet, Take 1 tablet by mouth daily., Disp: , Rfl:    rosuvastatin (CRESTOR) 10 MG tablet, Take 1 tablet by mouth 2 (two) times a week., Disp: , Rfl:    sitaGLIPtin (JANUVIA) 50 MG tablet, Take 50 mg by mouth daily., Disp: , Rfl:   Social History   Tobacco Use  Smoking  Status Never  Smokeless Tobacco Never    Allergies  Allergen Reactions   Latex Rash   Objective:  There were no vitals filed for this visit. There is no height or weight on file to calculate BMI. Constitutional Well developed. Well nourished.  Vascular Dorsalis pedis pulses palpable bilaterally. Posterior tibial pulses palpable bilaterally. Capillary refill normal to all digits.  No cyanosis or clubbing noted. Pedal hair growth normal.  Neurologic Normal speech. Oriented to person, place, and time. Epicritic sensation to light touch grossly present bilaterally.  Dermatologic Nails well groomed and normal in appearance. No open wounds. No skin lesions.  Orthopedic: Normal joint ROM without pain or crepitus bilaterally. No visible deformities. Tender to palpation at the calcaneal tuber right. No pain with calcaneal squeeze right. Ankle ROM diminished range of motion right. Silfverskiold Test: positive right.   Radiographs: None  Assessment:   1. Plantar fasciitis of right foot    Plan:  Patient was evaluated and treated and all questions answered.  Plantar Fasciitis, right - XR reviewed as above.  - Educated on icing and stretching. Instructions given.  - Injection delivered to the plantar fascia as below. - DME: Plantar fascial brace dispensed to support the medial longitudinal  arch of the foot and offload pressure from the heel and prevent arch collapse during weightbearing - Pharmacologic management: None  Procedure: Injection Tendon/Ligament Location: Right plantar fascia at the glabrous junction; medial approach. Skin Prep: alcohol Injectate: 0.5 cc 0.5% marcaine plain, 0.5 cc of 1% Lidocaine, 0.5 cc kenalog 10. Disposition: Patient tolerated procedure well. Injection site dressed with a band-aid.  No follow-ups on file.

## 2023-06-29 ENCOUNTER — Ambulatory Visit: Payer: Medicare HMO | Admitting: Podiatry

## 2023-06-29 DIAGNOSIS — M62461 Contracture of muscle, right lower leg: Secondary | ICD-10-CM

## 2023-06-29 DIAGNOSIS — M722 Plantar fascial fibromatosis: Secondary | ICD-10-CM

## 2023-06-29 NOTE — Progress Notes (Signed)
Subjective:  Patient ID: Jennifer Ward, female    DOB: May 14, 1951,  MRN: [id number]  No chief complaint on file.   72 y.o. female presents with the above complaint.  Patient presents for follow-up of her plantar fasciitis.  She states she is doing a lot better.  She still has some residual pain she would like to discuss next treatment plan   Review of Systems: Negative except as noted in the HPI. Denies N/V/F/Ch.  Past Medical History:  Diagnosis Date   Anemia    no current med.   Arthritis    toes   Complication of anesthesia    states is hard to wake up post-op   Enlarged heart    no cardiologist, per pt.   Hallux rigidus of left foot 09/2014   Hypertension    states under control with med., has been on med. x 8 yr.   Non-insulin dependent type 2 diabetes mellitus (HCC)    Seasonal allergies    Sinus headache     Current Outpatient Medications:    allopurinol (ZYLOPRIM) 300 MG tablet, Take 300 mg by mouth daily., Disp: , Rfl: 3   estradiol (VIVELLE-DOT) 0.025 MG/24HR, Place 1 patch onto the skin 2 (two) times a week. On Tuesday and Friday, Disp: , Rfl:    loratadine (CLARITIN) 10 MG tablet, Take 10 mg by mouth daily., Disp: , Rfl: 2   losartan (COZAAR) 25 MG tablet, Take 25 mg by mouth daily. , Disp: , Rfl: 2   metFORMIN (GLUCOPHAGE-XR) 500 MG 24 hr tablet, Take 1 tablet by mouth daily., Disp: , Rfl:    rosuvastatin (CRESTOR) 10 MG tablet, Take 1 tablet by mouth 2 (two) times a week., Disp: , Rfl:    sitaGLIPtin (JANUVIA) 50 MG tablet, Take 50 mg by mouth daily., Disp: , Rfl:   Social History   Tobacco Use  Smoking Status Never  Smokeless Tobacco Never    Allergies  Allergen Reactions   Latex Rash   Objective:  There were no vitals filed for this visit. There is no height or weight on file to calculate BMI. Constitutional Well developed. Well nourished.  Vascular Dorsalis pedis pulses palpable bilaterally. Posterior tibial pulses palpable bilaterally. Capillary  refill normal to all digits.  No cyanosis or clubbing noted. Pedal hair growth normal.  Neurologic Normal speech. Oriented to person, place, and time. Epicritic sensation to light touch grossly present bilaterally.  Dermatologic Nails well groomed and normal in appearance. No open wounds. No skin lesions.  Orthopedic: Normal joint ROM without pain or crepitus bilaterally. No visible deformities. Tender to palpation at the calcaneal tuber right. No pain with calcaneal squeeze right. Ankle ROM diminished range of motion right. Silfverskiold Test: positive right.   Radiographs: None  Assessment:   No diagnosis found.  Plan:  Patient was evaluated and treated and all questions answered.  Plantar Fasciitis, right with underlying gastrocnemius equinus - XR reviewed as above.  - Educated on icing and stretching. Instructions given.  -Second injection delivered to the plantar fascia as below. - DME: Plantar fascial brace dispensed to support the medial longitudinal arch of the foot and offload pressure from the heel and prevent arch collapse during weightbearing - Pharmacologic management: None  Procedure: Injection Tendon/Ligament Location: Right plantar fascia at the glabrous junction; medial approach. Skin Prep: alcohol Injectate: 0.5 cc 0.5% marcaine plain, 0.5 cc of 1% Lidocaine, 0.5 cc kenalog 10. Disposition: Patient tolerated procedure well. Injection site dressed with a band-aid.  No follow-ups  on file.

## 2024-05-06 ENCOUNTER — Other Ambulatory Visit: Payer: Self-pay | Admitting: Internal Medicine

## 2024-05-06 ENCOUNTER — Ambulatory Visit
Admission: RE | Admit: 2024-05-06 | Discharge: 2024-05-06 | Disposition: A | Discharge status: Home / Self Care | Source: Ambulatory Visit | Attending: Internal Medicine | Admitting: Internal Medicine

## 2024-05-06 DIAGNOSIS — G8929 Other chronic pain: Secondary | ICD-10-CM

## 2024-05-06 DIAGNOSIS — M542 Cervicalgia: Secondary | ICD-10-CM

## 2024-05-06 DIAGNOSIS — R079 Chest pain, unspecified: Secondary | ICD-10-CM
